# Patient Record
Sex: Male | Born: 1995 | Race: White | Hispanic: No | Marital: Single | Smoking: Never smoker
Health system: Southern US, Community
[De-identification: ages and names within clinical notes are randomized; demographics above are authoritative.]

## PROBLEM LIST (undated history)

## (undated) DIAGNOSIS — F319 Bipolar disorder, unspecified: Secondary | ICD-10-CM

## (undated) DIAGNOSIS — F840 Autistic disorder: Secondary | ICD-10-CM

## (undated) DIAGNOSIS — F419 Anxiety disorder, unspecified: Secondary | ICD-10-CM

## (undated) DIAGNOSIS — T7840XA Allergy, unspecified, initial encounter: Secondary | ICD-10-CM

## (undated) DIAGNOSIS — G4700 Insomnia, unspecified: Secondary | ICD-10-CM

## (undated) HISTORY — DX: Insomnia, unspecified: G47.00

## (undated) HISTORY — DX: Bipolar disorder, unspecified: F31.9

## (undated) HISTORY — DX: Autistic disorder: F84.0

## (undated) HISTORY — DX: Anxiety disorder, unspecified: F41.9

## (undated) HISTORY — DX: Allergy, unspecified, initial encounter: T78.40XA

---
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
## 2015-07-01 DIAGNOSIS — K029 Dental caries, unspecified: Secondary | ICD-10-CM | POA: Insufficient documentation

## 2016-02-08 DIAGNOSIS — R94120 Abnormal auditory function study: Secondary | ICD-10-CM | POA: Insufficient documentation

## 2017-12-26 DIAGNOSIS — K5909 Other constipation: Secondary | ICD-10-CM | POA: Insufficient documentation

## 2018-02-10 DIAGNOSIS — R6250 Unspecified lack of expected normal physiological development in childhood: Secondary | ICD-10-CM | POA: Insufficient documentation

## 2018-02-10 DIAGNOSIS — H6123 Impacted cerumen, bilateral: Secondary | ICD-10-CM | POA: Insufficient documentation

## 2019-10-05 ENCOUNTER — Other Ambulatory Visit: Payer: Self-pay

## 2019-10-05 ENCOUNTER — Ambulatory Visit (HOSPITAL_COMMUNITY)
Admission: EM | Admit: 2019-10-05 | Discharge: 2019-10-05 | Disposition: A | Discharge status: Home / Self Care | Payer: Medicare Other | Attending: Family Medicine | Admitting: Family Medicine

## 2019-10-05 ENCOUNTER — Encounter (HOSPITAL_COMMUNITY): Payer: Self-pay

## 2019-10-05 DIAGNOSIS — W228XXA Striking against or struck by other objects, initial encounter: Secondary | ICD-10-CM | POA: Diagnosis not present

## 2019-10-05 DIAGNOSIS — S0990XA Unspecified injury of head, initial encounter: Secondary | ICD-10-CM

## 2019-10-05 DIAGNOSIS — U071 COVID-19: Secondary | ICD-10-CM | POA: Diagnosis not present

## 2019-10-05 NOTE — ED Triage Notes (Signed)
Pt caregiver states pt banged head purposely on wall last night. Negative LOC, or n/v afterwards. Pt presents with edema, and laceration to mid-forehead; no acute bleeding. Pt non-verbal but able to touch injured area w/o obvious pain, able to visually use electronic game; PERLLA.

## 2019-10-05 NOTE — Discharge Instructions (Signed)
Tylenol and ibuprofen as needed for pain and swelling Ice Monitor for patient to return to normal/baseline habits and movements If developing weakness, not moving eyes appropriately, not normal resposiveness follow up in ED  COVID swab from 12/30 was positive I recommend retesting other resident and employees especially if developing symptoms Quarantine 10 days, and monitor for any symptoms to develop  Follow up for any concerns

## 2019-10-07 NOTE — ED Provider Notes (Signed)
[place]    CSN: [id number] Arrival date & time: 10/05/19  [place]      History   Chief Complaint Chief Complaint  Patient presents with  . Head Injury    HPI Shane Dickerson is a 24 y.o. male history of autism, bipolar type I, nonverbal presenting today along with group home employee for evaluation of head injury.  Last night patient hit his head repetitively on the wall.  This morning he has developed a area of swelling and minor scabbing to his central forehead.  Group home employee states that this is normal behavior for him.  Since he has been at his baseline and acting normally.  He has been eating and drinking appropriately.  Peripherally moving his eyes as well as extremities.  Denies any vomiting.  Of note patient did recently get Covid tested due to exposure at group home and his test is positive.  Denies any current URI symptoms or fevers.  HPI  Past Medical History:  Diagnosis Date  . Allergy    seasonal allergies  . Anxiety   . Autism spectrum   . Bipolar 1 disorder ([place])   . Insomnia     There are no problems to display for this patient.   History reviewed. No pertinent surgical history.     Home Medications    Prior to Admission medications   Medication Sig Start Date End Date Taking? Authorizing Provider  cetirizine (ZYRTEC) 10 MG tablet Take 10 mg by mouth.    [provider]  clonazePAM (KLONOPIN) 1 MG tablet Take 1 mg by mouth 2 (two) times daily.    [provider]  fluticasone (FLOVENT DISKUS) 50 MCG/BLIST diskus inhaler Inhale 1 puff into the lungs daily.    [provider]  OLANZapine (ZYPREXA) 5 MG tablet Take 5 mg by mouth at bedtime.    [provider]  Oxcarbazepine (TRILEPTAL) 300 MG tablet Take 300 mg by mouth daily. 1.5 tablets    [provider]  risperiDONE (RISPERDAL) 2 MG tablet Take 2 mg by mouth at bedtime.    [provider]  selenium sulfide (SELSUN) 2.5 % shampoo  Apply 1 application topically daily as needed for irritation.    [provider]    Family History History reviewed. No pertinent family history.  Social History Social History   Tobacco Use  . Smoking status: Never Smoker  . Smokeless tobacco: Never Used  Substance Use Topics  . Alcohol use: Never  . Drug use: Never     Allergies   Patient has no allergy information on record.   Review of Systems Review of Systems  Constitutional: Negative for fatigue and fever.  HENT: Positive for facial swelling. Negative for congestion, sinus pressure and sore throat.   Eyes: Negative for photophobia, pain and visual disturbance.  Respiratory: Negative for cough and shortness of breath.   Cardiovascular: Negative for chest pain.  Gastrointestinal: Negative for abdominal pain, nausea and vomiting.  Genitourinary: Negative for decreased urine volume and hematuria.  Musculoskeletal: Negative for myalgias, neck pain and neck stiffness.  Neurological: Positive for headaches. Negative for dizziness, syncope, facial asymmetry, speech difficulty, weakness, light-headedness and numbness.     Physical Exam Triage Vital Signs ED Triage Vitals  Enc Vitals Group     BP 10/05/19 1751 114/71     Pulse Rate 10/05/19 1751 83     Resp 10/05/19 1751 18     Temp 10/05/19 1751 98.3 F (36.8 C)  Temp Source 10/05/19 1751 Oral     SpO2 10/05/19 1751 98 %     Weight --      Height --      Head Circumference --      Peak Flow --      Pain Score 10/05/19 1746 0     Pain Loc --      Pain Edu? --      Excl. in GC? --    No data found.  Updated Vital Signs BP 114/71 (BP Location: Right Arm)   Pulse 83   Temp 98.3 F (36.8 C) (Oral)   Resp 18   SpO2 98%   Visual Acuity Right Eye Distance:   Left Eye Distance:   Bilateral Distance:    Right Eye Near:   Left Eye Near:    Bilateral Near:     Physical Exam Vitals and nursing note reviewed.  Constitutional:      Appearance:  He is well-developed.     Comments: No acute distress; sitting on exam table playing video game  HENT:     Head: Normocephalic and atraumatic.     Comments: Central forehead with area of swelling and superficial abrasion, tender to touch in this area, no surrounding erythema or warmth, no palpable crepitus, no appearance of grimacing or discomfort with palpating around orbits    Right Ear: Tympanic membrane normal.     Left Ear: Tympanic membrane normal.     Ears:     Comments: No hemotympanum bilaterally    Nose: Nose normal.     Mouth/Throat:     Comments: Oral mucosa pink and moist, no tonsillar enlargement or exudate. Posterior pharynx patent and nonerythematous, no uvula deviation or swelling. Normal phonation. Palate elevates symmetrically Eyes:     Extraocular Movements: Extraocular movements intact.     Conjunctiva/sclera: Conjunctivae normal.     Pupils: Pupils are equal, round, and reactive to light.     Comments: Eye tracking grossly normal  Cardiovascular:     Rate and Rhythm: Normal rate.  Pulmonary:     Effort: Pulmonary effort is normal. No respiratory distress.     Comments: Breathing comfortably at rest, CTABL, no wheezing, rales or other adventitious sounds auscultated Abdominal:     General: There is no distension.  Musculoskeletal:        General: Normal range of motion.     Cervical back: Neck supple.     Comments: Moving all extremities appropriately  Skin:    General: Skin is warm and dry.  Neurological:     General: No focal deficit present.     Mental Status: He is alert and oriented to person, place, and time. Mental status is at baseline.     Cranial Nerves: No cranial nerve deficit.     Motor: No weakness.      UC Treatments / Results  Labs (all labs ordered are listed, but only abnormal results are displayed) Labs Reviewed - No data to display  EKG   Radiology No results found.  Procedures Procedures (including critical care  time)  Medications Ordered in UC Medications - No data to display  Initial Impression / Assessment and Plan / UC Course  I have reviewed the triage vital signs and the nursing notes.  Pertinent labs & imaging results that were available during my care of the patient were reviewed by me and considered in my medical decision making (see chart for details).     Patient appears to  be at baseline, no neuro deficits noted, but given patient nonverbal and autism and difficult to fully complete thorough neuro exam.  Discussed with group home member to continue to monitor for patient to return to baseline if at any point he seems less responsive any abnormalities with eye movements, not moving extremities appropriately or developing vomiting to follow-up in emergency room for CT scan.  Tylenol ibuprofen and ice.  Discussed quarantine recommendations given positive Covid as patient and group home were unaware of this result as he had had a negative rapid.  Discussed strict return precautions. Patient verbalized understanding and is agreeable with plan.  Final Clinical Impressions(s) / UC Diagnoses   Final diagnoses:  Minor head injury, initial encounter  COVID-19 virus detected     Discharge Instructions     Tylenol and ibuprofen as needed for pain and swelling Ice Monitor for patient to return to normal/baseline habits and movements If developing weakness, not moving eyes appropriately, not normal resposiveness follow up in ED  COVID swab from 12/30 was positive I recommend retesting other resident and employees especially if developing symptoms Quarantine 10 days, and monitor for any symptoms to develop  Follow up for any concerns   ED Prescriptions    None     PDMP not reviewed this encounter.   Sharyon Cable [place] C, PA-C 10/07/19 1213

## 2019-12-28 ENCOUNTER — Emergency Department (HOSPITAL_COMMUNITY)
Admission: EM | Admit: 2019-12-28 | Discharge: 2019-12-28 | Disposition: A | Discharge status: Home / Self Care | Payer: Medicare Other | Attending: Emergency Medicine | Admitting: Emergency Medicine

## 2019-12-28 ENCOUNTER — Emergency Department (HOSPITAL_COMMUNITY): Payer: Medicare Other

## 2019-12-28 ENCOUNTER — Encounter (HOSPITAL_COMMUNITY): Payer: Self-pay | Admitting: Radiology

## 2019-12-28 DIAGNOSIS — Z87891 Personal history of nicotine dependence: Secondary | ICD-10-CM | POA: Insufficient documentation

## 2019-12-28 DIAGNOSIS — S0083XA Contusion of other part of head, initial encounter: Secondary | ICD-10-CM | POA: Insufficient documentation

## 2019-12-28 DIAGNOSIS — Y999 Unspecified external cause status: Secondary | ICD-10-CM | POA: Insufficient documentation

## 2019-12-28 DIAGNOSIS — Z79899 Other long term (current) drug therapy: Secondary | ICD-10-CM | POA: Insufficient documentation

## 2019-12-28 DIAGNOSIS — Y929 Unspecified place or not applicable: Secondary | ICD-10-CM | POA: Insufficient documentation

## 2019-12-28 DIAGNOSIS — Y939 Activity, unspecified: Secondary | ICD-10-CM | POA: Diagnosis not present

## 2019-12-28 DIAGNOSIS — S0990XA Unspecified injury of head, initial encounter: Secondary | ICD-10-CM

## 2019-12-28 DIAGNOSIS — F840 Autistic disorder: Secondary | ICD-10-CM | POA: Insufficient documentation

## 2019-12-28 DIAGNOSIS — X79XXXA Intentional self-harm by blunt object, initial encounter: Secondary | ICD-10-CM | POA: Diagnosis not present

## 2019-12-28 LAB — COMPREHENSIVE METABOLIC PANEL
ALT: 22 U/L (ref 0–44)
AST: 17 U/L (ref 15–41)
Albumin: 4 g/dL (ref 3.5–5.0)
Alkaline Phosphatase: 73 U/L (ref 38–126)
Anion gap: 7 (ref 5–15)
BUN: 14 mg/dL (ref 6–20)
CO2: 24 mmol/L (ref 22–32)
Calcium: 8.8 mg/dL — ABNORMAL LOW (ref 8.9–10.3)
Chloride: 108 mmol/L (ref 98–111)
Creatinine, Ser: 0.75 mg/dL (ref 0.61–1.24)
GFR calc Af Amer: >60 mL/min (ref >60)
GFR calc non Af Amer: >60 mL/min (ref >60)
Glucose, Bld: 108 mg/dL — ABNORMAL HIGH (ref 70–99)
Potassium: 4 mmol/L (ref 3.5–5.1)
Sodium: 139 mmol/L (ref 135–145)
Total Bilirubin: 0.6 mg/dL (ref 0.3–1.2)
Total Protein: 7 g/dL (ref 6.5–8.1)

## 2019-12-28 LAB — CBC WITH DIFFERENTIAL/PLATELET
Abs Immature Granulocytes: 0.01 10*3/uL (ref 0.00–0.07)
Basophils Absolute: 0 10*3/uL (ref 0.0–0.1)
Basophils Relative: 0 %
Eosinophils Absolute: 0.1 10*3/uL (ref 0.0–0.5)
Eosinophils Relative: 2 %
HCT: 39.6 % (ref 39.0–52.0)
Hemoglobin: 13.2 g/dL (ref 13.0–17.0)
Immature Granulocytes: 0 %
Lymphocytes Relative: 34 %
Lymphs Abs: 1.4 10*3/uL (ref 0.7–4.0)
MCH: 30.1 pg (ref 26.0–34.0)
MCHC: 33.3 g/dL (ref 30.0–36.0)
MCV: 90.4 fL (ref 80.0–100.0)
Monocytes Absolute: 0.4 10*3/uL (ref 0.1–1.0)
Monocytes Relative: 9 %
Neutro Abs: 2.2 10*3/uL (ref 1.7–7.7)
Neutrophils Relative %: 55 %
Platelets: 267 10*3/uL (ref 150–400)
RBC: 4.38 MIL/uL (ref 4.22–5.81)
RDW: 11.9 % (ref 11.5–15.5)
WBC: 4 10*3/uL (ref 4.0–10.5)
nRBC: 0 % (ref 0.0–0.2)

## 2019-12-28 LAB — URINALYSIS, ROUTINE W REFLEX MICROSCOPIC
Bilirubin Urine: NEGATIVE
Glucose, UA: NEGATIVE mg/dL
Hgb urine dipstick: NEGATIVE
Ketones, ur: NEGATIVE mg/dL
Leukocytes,Ua: NEGATIVE
Nitrite: NEGATIVE
Protein, ur: NEGATIVE mg/dL
Specific Gravity, Urine: 1.021 (ref 1.005–1.030)
pH: 7 (ref 5.0–8.0)

## 2019-12-28 NOTE — ED Triage Notes (Signed)
24 yo male from a group home [facility] [address] brought in by [facility]. Pt ambulated to ED room 21 independently and without incident. Per staff at group home, pt has been awake for the past 3 days and presenting with "manic" behavior. Pt removed his safety helmet today and repeatedly hit himself with it on head. Lac to forehead, bleeding controlled on arrival to ED. Staff member at bedside.

## 2019-12-28 NOTE — ED Provider Notes (Signed)
[facility] DEPT Provider Note   CSN: [id number] Arrival date & time: 12/28/19  1008     History Chief Complaint  Patient presents with  . hitting head with helmet    Shane Dickerson is a 24 y.o. male.  24 y.o male with a PMH of Autism, Bipolar presents to the ED with a chief via [place] for head injury. According to nursing staff at the bedside patient wears a safety helmet, he removed the helmet today was hitting himself on the front of the head with the helmet. He has a minor laceration along with hematoma noted to the area. According to facility, he has not slept in 3 days, and his behavior has continued to worsen. RN staffing will be coming into ED to provide further history. Patient is non verbal at baseline.  The history is provided by medical records and a caregiver.       Past Medical History:  Diagnosis Date  . Allergy    seasonal allergies  . Anxiety   . Autism spectrum   . Bipolar 1 disorder ([place])   . Insomnia     There are no problems to display for this patient.   No past surgical history on file.     No family history on file.  Social History   Tobacco Use  . Smoking status: Never Smoker  . Smokeless tobacco: Never Used  Substance Use Topics  . Alcohol use: Never  . Drug use: Never    Home Medications Prior to Admission medications   Medication Sig Start Date End Date Taking? Authorizing Provider  acetaminophen (TYLENOL) 325 MG tablet Take 650 mg by mouth every 4 (four) hours as needed for mild pain, fever or headache.   Yes [provider]  benztropine (COGENTIN) 0.5 MG tablet Take 0.5 mg by mouth at bedtime.   Yes [provider]  bismuth subsalicylate (PEPTO BISMOL) 262 MG chewable tablet Chew 524 mg by mouth as needed for diarrhea or loose stools (nausea).   Yes [provider]  carbamide peroxide (DEBROX) 6.5 % OTIC solution Place 2 drops into both ears 2 (two) times daily as needed (ear  ache).   Yes [provider]  cetirizine (ZYRTEC) 10 MG tablet Take 10 mg by mouth daily.    Yes [provider]  clonazePAM (KLONOPIN) 1 MG tablet Take 1 mg by mouth 2 (two) times daily.   Yes [provider]  fluticasone (VERAMYST) 27.5 MCG/SPRAY nasal spray Place 2 sprays into the nose daily.   Yes [provider]  guaifenesin (ROBITUSSIN) 100 MG/5ML syrup Take 200 mg by mouth 3 (three) times daily as needed for cough or congestion.   Yes [provider]  hydrocortisone cream 1 % Apply 1 application topically 3 (three) times daily as needed for itching.   Yes [provider]  ketoconazole (NIZORAL) 2 % shampoo Apply 1 application topically as needed for irritation.   Yes [provider]  loperamide (IMODIUM A-D) 2 MG tablet Take 2-4 mg by mouth 4 (four) times daily as needed for diarrhea or loose stools.   Yes [provider]  neomycin-bacitracin-polymyxin (NEOSPORIN) ointment Apply 1 application topically 2 (two) times daily as needed for wound care.   Yes [provider]  OLANZapine (ZYPREXA) 5 MG tablet Take 5 mg by mouth at bedtime.   Yes [provider]  Oxcarbazepine (TRILEPTAL) 300 MG tablet Take 450 mg by mouth 2 (two) times daily. 1.5 tablets  Yes [provider]  QUEtiapine (SEROQUEL XR) 50 MG TB24 24 hr tablet Take 50 mg by mouth daily.   Yes [provider]  risperiDONE (RISPERDAL) 1 MG tablet Take 1 mg by mouth 3 (three) times daily.   Yes [provider]  selenium sulfide (SELSUN) 2.5 % shampoo Apply 1 application topically once a week. saturdays   Yes [provider]  traZODone (DESYREL) 100 MG tablet Take 100 mg by mouth at bedtime.   Yes [provider]    Allergies    Patient has no known allergies.  Review of Systems   Review of Systems  Unable to perform ROS: Patient nonverbal    Physical Exam Updated Vital Signs BP 129/87   Pulse  62   Temp 97.8 F (36.6 C) (Oral)   Resp 18   SpO2 97%   Physical Exam Vitals and nursing note reviewed.  Constitutional:      Appearance: Normal appearance.  HENT:     Head: Normocephalic.      Nose: Nose normal.  Eyes:     Pupils: Pupils are equal, round, and reactive to light.  Cardiovascular:     Rate and Rhythm: Normal rate.  Pulmonary:     Effort: Pulmonary effort is normal.     Breath sounds: No wheezing or rales.  Abdominal:     General: Abdomen is flat.     Tenderness: There is no abdominal tenderness. There is no right CVA tenderness or left CVA tenderness.  Skin:    General: Skin is warm and dry.  Neurological:     Mental Status: He is alert. Mental status is at baseline.     Comments: Patient is nonverbal at baseline, according to facility he is within his baseline.     ED Results / Procedures / Treatments   Labs (all labs ordered are listed, but only abnormal results are displayed) Labs Reviewed  COMPREHENSIVE METABOLIC PANEL - Abnormal; Notable for the following components:      Result Value   Glucose, Bld 108 (*)    Calcium 8.8 (*)    All other components within normal limits  CBC WITH DIFFERENTIAL/PLATELET  URINALYSIS, ROUTINE W REFLEX MICROSCOPIC    EKG None  Radiology CT Head Wo Contrast  Result Date: 12/28/2019 CLINICAL DATA:  Patient with mania. EXAM: CT HEAD WITHOUT CONTRAST TECHNIQUE: Contiguous axial images were obtained from the base of the skull through the vertex without intravenous contrast. COMPARISON:  None. FINDINGS: Brain: No evidence of acute infarction, hemorrhage, hydrocephalus, extra-axial collection or mass lesion/mass effect. Vascular: Unremarkable Skull: Intact. Sinuses/Orbits: Paranasal sinuses are well aerated. Mastoid air cells are unremarkable. Orbits are unremarkable. Other: None. IMPRESSION: No acute intracranial process. Electronically Signed   By: Annia Belt M.D.   On: 12/28/2019 13:47    Procedures Procedures  (including critical care time)  Medications Ordered in ED Medications - No data to display  ED Course  I have reviewed the triage vital signs and the nursing notes.  Pertinent labs & imaging results that were available during my care of the patient were reviewed by me and considered in my medical decision making (see chart for details).    MDM Rules/Calculators/A&P   Patient non verbal at baseline with a hx of head hitting presents to the ED for hitting his head with his safety helmet at [facility] facility.  He is accompanied by nursing staff who report patient has not slept in several days, states he is now manic, due  to his decrease sleep.  He is currently on trazodone 100 mg for bedtime but this medication is not helping with his sleep pattern.   Patient is at his baseline per nursing staff from [facility] facility.  Interpretation of his labs by me, CBC without any leukocytosis, hemoglobin is within normal limits.  CMP without any electrolyte derangement, LFTs are unremarkable.  UA without any leukocytes, nitrites, no urinary symptoms, lower suspicion for UTI.  A CT head has been obtained as patient repeatedly struck himself in the head with helmet.  1:39 PM results of all testing were discussed with nurse at the bedside, who is currently taking care of him at [facility] facility.  CT head is currently pending, discussed that he will likely be dispositioned home after turns as long as it is within normal limits.  CT Head without any acute process. Return precautions discussed at length.    Portions of this note were generated with Scientist, clinical (histocompatibility and immunogenetics). Dictation errors may occur despite best attempts at proofreading.  Final Clinical Impression(s) / ED Diagnoses Final diagnoses:  Injury of head, initial encounter    Rx / DC Orders ED Discharge Orders    None       Claude Manges, PA-C 12/28/19 1355    Lorre Nick, MD 12/29/19 [phone]

## 2019-12-28 NOTE — Discharge Instructions (Addendum)
Your laboratory results were within normal limits.  The CT of your head showed no acute findings.  Please follow up with your PCP for further management of your sleeping medication.

## 2020-02-03 ENCOUNTER — Other Ambulatory Visit: Payer: Self-pay

## 2020-02-03 ENCOUNTER — Encounter (HOSPITAL_COMMUNITY): Payer: Self-pay

## 2020-02-03 ENCOUNTER — Emergency Department (HOSPITAL_COMMUNITY)
Admission: EM | Admit: 2020-02-03 | Discharge: 2020-02-03 | Disposition: A | Discharge status: Home / Self Care | Payer: Medicare Other | Attending: Emergency Medicine | Admitting: Emergency Medicine

## 2020-02-03 ENCOUNTER — Emergency Department (HOSPITAL_COMMUNITY): Payer: Medicare Other

## 2020-02-03 DIAGNOSIS — Y929 Unspecified place or not applicable: Secondary | ICD-10-CM | POA: Diagnosis not present

## 2020-02-03 DIAGNOSIS — Y999 Unspecified external cause status: Secondary | ICD-10-CM | POA: Insufficient documentation

## 2020-02-03 DIAGNOSIS — S0990XA Unspecified injury of head, initial encounter: Secondary | ICD-10-CM | POA: Insufficient documentation

## 2020-02-03 DIAGNOSIS — Z79899 Other long term (current) drug therapy: Secondary | ICD-10-CM | POA: Insufficient documentation

## 2020-02-03 DIAGNOSIS — F840 Autistic disorder: Secondary | ICD-10-CM | POA: Insufficient documentation

## 2020-02-03 DIAGNOSIS — X79XXXA Intentional self-harm by blunt object, initial encounter: Secondary | ICD-10-CM | POA: Diagnosis not present

## 2020-02-03 DIAGNOSIS — Y9389 Activity, other specified: Secondary | ICD-10-CM | POA: Insufficient documentation

## 2020-02-03 NOTE — ED Provider Notes (Signed)
[facility]-EMERGENCY DEPT Provider Note   CSN: [id number] Arrival date & time: 02/03/20  1229     History Chief Complaint  Patient presents with  . Head Injury    Shane Dickerson is a 24 y.o. male.  HPI   Patient is a 24 year old male with a history of seasonal allergies, anxiety, autism spectrum disorder, bipolar 1 disorder, insomnia, who presents the emergency department today with his caregiver for evaluation of a head injury.  Patient is nonverbal at baseline therefore there is a level 5 caveat.  History is obtained from the patient's caregiver who is at bedside.  She states that the patient normally wears a helmet to protect his head as he frequently hits his head on things when he gets frustrated.  Yesterday took his helmet off and repeatedly hit his head with it.  He has an abrasion and hematoma to the forehead.  He was seen at urgent care prior to arrival and sent here for head CT.  She denies any loss of consciousness or episodes of vomiting since the injury occurred.  He has had no changes in his behavior.  She states this is very normal behavior for him  Past Medical History:  Diagnosis Date  . Allergy    seasonal allergies  . Anxiety   . Autism spectrum   . Bipolar 1 disorder (HCC)   . Insomnia     There are no problems to display for this patient.   History reviewed. No pertinent surgical history.     Family History  Family history unknown: Yes    Social History   Tobacco Use  . Smoking status: Never Smoker  . Smokeless tobacco: Never Used  Substance Use Topics  . Alcohol use: Never  . Drug use: Never    Home Medications Prior to Admission medications   Medication Sig Start Date End Date Taking? Authorizing Provider  acetaminophen (TYLENOL) 325 MG tablet Take 650 mg by mouth every 4 (four) hours as needed for mild pain, fever or headache.    [provider]  benztropine (COGENTIN) 0.5 MG tablet Take 0.5 mg by mouth at  bedtime.    [provider]  bismuth subsalicylate (PEPTO BISMOL) 262 MG chewable tablet Chew 524 mg by mouth as needed for diarrhea or loose stools (nausea).    [provider]  carbamide peroxide (DEBROX) 6.5 % OTIC solution Place 2 drops into both ears 2 (two) times daily as needed (ear ache).    [provider]  cetirizine (ZYRTEC) 10 MG tablet Take 10 mg by mouth daily.     [provider]  clonazePAM (KLONOPIN) 1 MG tablet Take 1 mg by mouth 2 (two) times daily.    [provider]  fluticasone (VERAMYST) 27.5 MCG/SPRAY nasal spray Place 2 sprays into the nose daily.    [provider]  guaifenesin (ROBITUSSIN) 100 MG/5ML syrup Take 200 mg by mouth 3 (three) times daily as needed for cough or congestion.    [provider]  hydrocortisone cream 1 % Apply 1 application topically 3 (three) times daily as needed for itching.    [provider]  ketoconazole (NIZORAL) 2 % shampoo Apply 1 application topically as needed for irritation.    [provider]  loperamide (IMODIUM A-D) 2 MG tablet Take 2-4 mg by mouth 4 (four) times daily as needed for diarrhea or loose stools.    [provider]  neomycin-bacitracin-polymyxin (NEOSPORIN) ointment Apply 1 application topically 2 (two)  times daily as needed for wound care.    [provider]  OLANZapine (ZYPREXA) 5 MG tablet Take 5 mg by mouth at bedtime.    [provider]  Oxcarbazepine (TRILEPTAL) 300 MG tablet Take 450 mg by mouth 2 (two) times daily. 1.5 tablets     [provider]  QUEtiapine (SEROQUEL XR) 50 MG TB24 24 hr tablet Take 50 mg by mouth daily.    [provider]  risperiDONE (RISPERDAL) 1 MG tablet Take 1 mg by mouth 3 (three) times daily.    [provider]  selenium sulfide (SELSUN) 2.5 % shampoo Apply 1 application topically once a week. saturdays    [provider]  traZODone (DESYREL) 100 MG  tablet Take 100 mg by mouth at bedtime.    [provider]    Allergies    Patient has no known allergies.  Review of Systems   Review of Systems  Unable to perform ROS: Patient nonverbal  Gastrointestinal: Negative for nausea and vomiting.  Neurological:       Head injury, no loc    Physical Exam Updated Vital Signs BP 132/75 (BP Location: Right Arm)   Pulse (!) 106   Temp 98.7 F (37.1 C) (Oral)   Resp 18   Ht 5\' 11"  (1.803 m)   Wt 113.4 kg   SpO2 99%   BMI 34.87 kg/m   Physical Exam Constitutional:      General: He is not in acute distress.    Appearance: He is well-developed.  HENT:     Head:     Comments: Small hematoma with abrasion to the forehead Eyes:     Conjunctiva/sclera: Conjunctivae normal.  Cardiovascular:     Rate and Rhythm: Normal rate.  Pulmonary:     Effort: Pulmonary effort is normal.  Skin:    General: Skin is warm and dry.  Neurological:     Mental Status: He is alert and oriented to person, place, and time.     Comments: Mental Status:  Alert, nonverbal at baseline, follows commands,  Cranial Nerves:  II: pupils equal, round, reactive to light III,IV, VI: ptosis not present, extra-ocular motions intact bilaterally  V,VII: smile symmetric, facial light touch sensation equal VIII: hearing grossly normal to voice  X: uvula elevates symmetrically  XI: bilateral shoulder shrug symmetric and strong XII: midline tongue extension without fassiculations Motor:  Normal tone. 5/5 strength of BUE and BLE major muscle groups including strong and equal grip strength and dorsiflexion/plantar flexion Sensory: light touch normal in all extremities.     ED Results / Procedures / Treatments   Labs (all labs ordered are listed, but only abnormal results are displayed) Labs Reviewed - No data to display  EKG None  Radiology CT Head Wo Contrast  Result Date: 02/03/2020 CLINICAL DATA:  Headache, posttraumatic EXAM: CT HEAD WITHOUT  CONTRAST TECHNIQUE: Contiguous axial images were obtained from the base of the skull through the vertex without intravenous contrast. COMPARISON:  12/28/2019 FINDINGS: Brain: No evidence of acute infarction, hemorrhage, hydrocephalus, extra-axial collection or mass lesion/mass effect. Vascular: No hyperdense vessel or unexpected calcification. Skull: Normal. Negative for fracture or focal lesion. Sinuses/Orbits: No acute finding. Other: Soft tissue contusion of the midline forehead. IMPRESSION: No acute intracranial pathology. Electronically Signed   By: Eddie Candle M.D.   On: 02/03/2020 14:29    Procedures Procedures (including critical care time)  Medications Ordered in ED Medications - No data to display  ED Course  I  have reviewed the triage vital signs and the nursing notes.  Pertinent labs & imaging results that were available during my care of the patient were reviewed by me and considered in my medical decision making (see chart for details).    MDM Rules/Calculators/A&P                       24 year old male presenting for evaluation of head injury that occurred yesterday after he hit his head repeatedly on a helmet.  He has history of similar behavior when he gets frustrated.  He did not have any LOC.  He has had no episodes of vomiting.  He is at his mental baseline at this time.  He was seen in urgent care prior to arrival and sent here for CT scan to rule out intracranial injury.  CT reviewed/interpreted without evidence of intracranial bleeding or other acute abnormalities.  Caretaker advised on follow-up and return precautions.  She voiced understanding plan reasons to return.  All questions answered.  Patient stable for discharge with   Final Clinical Impression(s) / ED Diagnoses Final diagnoses:  Injury of head, initial encounter    Rx / DC Orders ED Discharge Orders    None       Karrie Meres, PA-C 02/03/20 1452    Bethann Berkshire, MD 02/05/20 [phone]

## 2020-02-03 NOTE — Discharge Instructions (Signed)
HEAD INJURY °If any of the following occur notify your physician or go to °the Hospital Emergency Department: ° Increased drowsiness, stupor or loss of consciousness ° Restlessness or convulsions (fits) ° Paralysis in arms or legs ° Temperature above 100º F ° Vomiting ° Severe headache ° Blood or clear fluid dripping from the nose or ears ° Stiffness of the neck ° Dizziness or blurred vision ° Pulsating pain in the eye ° Unequal pupils of eye ° Personality changes ° Any other unusual symptoms °PRECAUTIONS ° Do not take tranquilizers, sedatives, narcotics or alcohol ° Avoid aspirin. Use only acetaminophen (e.g. Tylenol) or °ibuprofen (e.g. Advil) for relief of pain. Follow directions °on the bottle for dosage. ° Use ice packs for comfort ° Getting plenty of rest and sleep helps the brain to heal. Do not try to do too much too fast. As you start to feel better, you can slowly and gradually return to your usual routine. ° Avoid activities that are physically demanding (e.g., sports, heavy housecleaning, exercising) or require a lot of thinking or concentration (e.g., working on the computer, playing video games). ° Ask your health care professional when you can safely drive a car, ride a bike, or operate heavy equipment. °MEDICATIONS °Use medications only as directed by your physician ° °Follow up with your primary care provider within 5-7 days for re-evaluation.  ° °

## 2020-02-03 NOTE — ED Triage Notes (Addendum)
Caregiver reports patient was banging his head against his helmet last night.   [facility] went to [facility] and told to come to ED for follow up to make sure he does not have a concussion since the patient dose this often.   Patient at his baseline per caregiver at bedside.

## 2020-02-03 NOTE — ED Notes (Signed)
Patient is autistic and did not want his vitals taken again.

## 2020-06-12 ENCOUNTER — Emergency Department (HOSPITAL_COMMUNITY)
Admission: EM | Admit: 2020-06-12 | Discharge: 2020-06-12 | Disposition: A | Discharge status: Home / Self Care | Payer: Medicare Other | Attending: Emergency Medicine | Admitting: Emergency Medicine

## 2020-06-12 ENCOUNTER — Other Ambulatory Visit: Payer: Self-pay

## 2020-06-12 ENCOUNTER — Emergency Department (HOSPITAL_COMMUNITY): Payer: Medicare Other

## 2020-06-12 DIAGNOSIS — Z79899 Other long term (current) drug therapy: Secondary | ICD-10-CM | POA: Insufficient documentation

## 2020-06-12 DIAGNOSIS — S0093XA Contusion of unspecified part of head, initial encounter: Secondary | ICD-10-CM | POA: Diagnosis present

## 2020-06-12 DIAGNOSIS — X58XXXA Exposure to other specified factors, initial encounter: Secondary | ICD-10-CM | POA: Diagnosis not present

## 2020-06-12 DIAGNOSIS — Y999 Unspecified external cause status: Secondary | ICD-10-CM | POA: Insufficient documentation

## 2020-06-12 DIAGNOSIS — Y929 Unspecified place or not applicable: Secondary | ICD-10-CM | POA: Diagnosis not present

## 2020-06-12 DIAGNOSIS — R456 Violent behavior: Secondary | ICD-10-CM | POA: Diagnosis not present

## 2020-06-12 DIAGNOSIS — Y939 Activity, unspecified: Secondary | ICD-10-CM | POA: Diagnosis not present

## 2020-06-12 DIAGNOSIS — R4689 Other symptoms and signs involving appearance and behavior: Secondary | ICD-10-CM

## 2020-06-12 LAB — COMPREHENSIVE METABOLIC PANEL
ALT: 32 U/L (ref 0–44)
AST: 26 U/L (ref 15–41)
Albumin: 4 g/dL (ref 3.5–5.0)
Alkaline Phosphatase: 93 U/L (ref 38–126)
Anion gap: 12 (ref 5–15)
BUN: 9 mg/dL (ref 6–20)
CO2: 23 mmol/L (ref 22–32)
Calcium: 8.8 mg/dL — ABNORMAL LOW (ref 8.9–10.3)
Chloride: 101 mmol/L (ref 98–111)
Creatinine, Ser: 0.73 mg/dL (ref 0.61–1.24)
GFR calc Af Amer: >60 mL/min (ref >60)
GFR calc non Af Amer: >60 mL/min (ref >60)
Glucose, Bld: 120 mg/dL — ABNORMAL HIGH (ref 70–99)
Potassium: 3.9 mmol/L (ref 3.5–5.1)
Sodium: 136 mmol/L (ref 135–145)
Total Bilirubin: 0.5 mg/dL (ref 0.3–1.2)
Total Protein: 7.3 g/dL (ref 6.5–8.1)

## 2020-06-12 LAB — CBC WITH DIFFERENTIAL/PLATELET
Abs Immature Granulocytes: 0.02 10*3/uL (ref 0.00–0.07)
Basophils Absolute: 0 10*3/uL (ref 0.0–0.1)
Basophils Relative: 0 %
Eosinophils Absolute: 0.1 10*3/uL (ref 0.0–0.5)
Eosinophils Relative: 2 %
HCT: 38.4 % — ABNORMAL LOW (ref 39.0–52.0)
Hemoglobin: 13.1 g/dL (ref 13.0–17.0)
Immature Granulocytes: 0 %
Lymphocytes Relative: 32 %
Lymphs Abs: 1.8 10*3/uL (ref 0.7–4.0)
MCH: 28.9 pg (ref 26.0–34.0)
MCHC: 34.1 g/dL (ref 30.0–36.0)
MCV: 84.8 fL (ref 80.0–100.0)
Monocytes Absolute: 0.6 10*3/uL (ref 0.1–1.0)
Monocytes Relative: 10 %
Neutro Abs: 3.1 10*3/uL (ref 1.7–7.7)
Neutrophils Relative %: 56 %
Platelets: 264 10*3/uL (ref 150–400)
RBC: 4.53 MIL/uL (ref 4.22–5.81)
RDW: 11.6 % (ref 11.5–15.5)
WBC: 5.6 10*3/uL (ref 4.0–10.5)
nRBC: 0 % (ref 0.0–0.2)

## 2020-06-12 LAB — URINALYSIS, ROUTINE W REFLEX MICROSCOPIC
Bilirubin Urine: NEGATIVE
Glucose, UA: NEGATIVE mg/dL
Hgb urine dipstick: NEGATIVE
Ketones, ur: NEGATIVE mg/dL
Leukocytes,Ua: NEGATIVE
Nitrite: NEGATIVE
Protein, ur: NEGATIVE mg/dL
Specific Gravity, Urine: 1.009 (ref 1.005–1.030)
pH: 7 (ref 5.0–8.0)

## 2020-06-12 MED ORDER — ACETAMINOPHEN 325 MG PO TABS
650.0000 mg | ORAL_TABLET | Freq: Once | ORAL | Status: AC
  Administered 2020-06-12: 650 mg via ORAL
  Filled 2020-06-12: qty 2

## 2020-06-12 MED ORDER — HYDROXYZINE HCL 25 MG PO TABS
50.0000 mg | ORAL_TABLET | Freq: Once | ORAL | Status: AC
  Administered 2020-06-12: 50 mg via ORAL
  Filled 2020-06-12: qty 2

## 2020-06-12 MED ORDER — MELATONIN 3 MG PO TABS: 3.0000 mg | ORAL_TABLET | Freq: Every evening | ORAL | 0 refills | Status: AC | PRN

## 2020-06-12 NOTE — ED Provider Notes (Signed)
[facility]-EMERGENCY DEPT Provider Note   CSN: [id number] Arrival date & time: 06/12/20  1503     History Chief Complaint  Patient presents with  . Medical Clearance    Shane Dickerson is a 24 y.o. male who presents today with group home staff for evaluation of behavioral outburst.  They report that today he has been aggressive with another resident.  They state that he has been banging his head.  No known loss of consciousness, he does not take any blood thinning medications.  They report no new medication changes.  As far as they are aware he has not been sick recently.  He is still eating and drinking normally.  No coughing or known fevers.  Level 5 caveat, patient is nonverbal.  HPI     Past Medical History:  Diagnosis Date  . Allergy    seasonal allergies  . Anxiety   . Autism spectrum   . Bipolar 1 disorder (HCC)   . Insomnia     There are no problems to display for this patient.   No past surgical history on file.     Family History  Family history unknown: Yes    Social History   Tobacco Use  . Smoking status: Never Smoker  . Smokeless tobacco: Never Used  Vaping Use  . Vaping Use: Never used  Substance Use Topics  . Alcohol use: Never  . Drug use: Never    Home Medications Prior to Admission medications   Medication Sig Start Date End Date Taking? Authorizing Provider  acetaminophen (TYLENOL) 325 MG tablet Take 650 mg by mouth every 4 (four) hours as needed for mild pain, fever or headache.   Yes [provider]  benztropine (COGENTIN) 1 MG tablet Take 1 mg by mouth 2 (two) times daily.   Yes [provider]  bismuth subsalicylate (BISMATROL) 262 MG chewable tablet Chew 524 mg by mouth as needed (nausea/upset stomach/max 8 doses in 24 hours).   Yes [provider]  carbamide peroxide (DEBROX) 6.5 % OTIC solution Place 2 drops into both ears 2 (two) times daily as needed (ear ache/impacted ear wax).     Yes [provider]  cetirizine (ZYRTEC) 10 MG tablet Take 10 mg by mouth at bedtime.    Yes [provider]  clonazePAM (KLONOPIN) 1 MG tablet Take 1 mg by mouth 2 (two) times daily.   Yes [provider]  FLUoxetine (PROZAC) 20 MG capsule Take 20 mg by mouth daily.   Yes [provider]  fluticasone (VERAMYST) 27.5 MCG/SPRAY nasal spray Place 1 spray into the nose daily.    Yes [provider]  guaiFENesin (SILTUSSIN DAS) 100 MG/5ML liquid Take 200 mg by mouth 3 (three) times daily as needed for cough or congestion.   Yes [provider]  haloperidol (HALDOL) 10 MG tablet Take 10 mg by mouth 2 (two) times daily.   Yes [provider]  hydrocortisone cream 1 % Apply 1 application topically 3 (three) times daily as needed (skin irritation).    Yes [provider]  hydrOXYzine (VISTARIL) 50 MG capsule Take 50 mg by mouth See admin instructions. Take one capsule (50 mg) by mouth three times daily - 8am, 4pm, 8pm   Yes [provider]  ketoconazole (NIZORAL) 2 % shampoo Apply 1 application topically daily as needed for irritation.    Yes [provider]  loperamide (IMODIUM A-D) 2 MG tablet Take 2-4 mg by mouth See admin  instructions. Take 2 tablets (4 mg) by mouth after loose b.m. and 1 tablet (2 mg0 after each loose b.m. - max 16 mg/day   Yes [provider]  magnesium hydroxide (MILK OF MAGNESIA) 400 MG/5ML suspension Take 30 mLs by mouth at bedtime as needed (constipation).   Yes [provider]  Neomycin-Bacitracin-Polymyxin (TRIPLE ANTIBIOTIC) [phone] OINT Apply 1 application topically 2 (two) times daily as needed (skin abrasians).   Yes [provider]  Oxcarbazepine (TRILEPTAL) 300 MG tablet Take 450 mg by mouth 2 (two) times daily.    Yes [provider]  selenium sulfide (SELSUN) 2.5 % shampoo Apply 1 application topically every Saturday.    Yes [provider]  temazepam (RESTORIL) 15 MG capsule Take 15 mg by mouth at bedtime.   Yes [provider]  zolpidem (AMBIEN) 10 MG tablet Take 10 mg by mouth at bedtime.   Yes [provider]  melatonin 3 MG TABS tablet Take 1 tablet (3 mg total) by mouth at bedtime as needed (Sleep). 06/12/20 07/12/20  Cristina Gong, PA-C    Allergies    Patient has no known allergies.  Review of Systems   Review of Systems  Unable to perform ROS: Patient nonverbal    Physical Exam Updated Vital Signs BP 124/86   Pulse 78   Temp 97.7 F (36.5 C)   Resp 14   SpO2 94%   Physical Exam Vitals and nursing note reviewed.  Constitutional:      General: He is not in acute distress.    Appearance: He is well-developed. He is not diaphoretic.  HENT:     Head: Normocephalic.     Comments: Contusion on anterior forehead.  No raccoon's eyes or battle signs bilaterally.    Right Ear: Tympanic membrane normal.     Left Ear: Tympanic membrane normal.     Mouth/Throat:     Mouth: Mucous membranes are moist.     Pharynx: Oropharynx is clear. No oropharyngeal exudate or posterior oropharyngeal erythema.  Eyes:     General: No scleral icterus.       Right eye: No discharge.        Left eye: No discharge.     Conjunctiva/sclera: Conjunctivae normal.  Cardiovascular:     Rate and Rhythm: Normal rate and regular rhythm.     Heart sounds: Normal heart sounds.  Pulmonary:     Effort: Pulmonary effort is normal. No respiratory distress.     Breath sounds: Normal breath sounds. No stridor.  Abdominal:     General: There is no distension.     Tenderness: There is no abdominal tenderness. There is no guarding.  Musculoskeletal:        General: No deformity.     Cervical back: Normal range of motion and neck supple.  Skin:    General: Skin is warm and dry.  Neurological:     Mental Status: He is alert.     Motor: No abnormal muscle tone.     Comments: Patient is awake, moves upper  and lower extremities spontaneously.  Vocalizes and gestures to indicate his wants and needs  Psychiatric:     Comments: Redirectable, cooperative, playing on his tablet in no obvious distress, currently baseline behavior per group home staff.     ED Results / Procedures / Treatments   Labs (all labs ordered are listed, but only abnormal results are displayed) Labs Reviewed  COMPREHENSIVE METABOLIC PANEL - Abnormal; Notable for the following  components:      Result Value   Glucose, Bld 120 (*)    Calcium 8.8 (*)    All other components within normal limits  CBC WITH DIFFERENTIAL/PLATELET - Abnormal; Notable for the following components:   HCT 38.4 (*)    All other components within normal limits  URINALYSIS, ROUTINE W REFLEX MICROSCOPIC - Abnormal; Notable for the following components:   Color, Urine STRAW (*)    All other components within normal limits    EKG None  Radiology No results found.  Procedures Procedures (including critical care time)  Medications Ordered in ED Medications  acetaminophen (TYLENOL) tablet 650 mg (650 mg Oral Given 06/12/20 1824)  hydrOXYzine (ATARAX/VISTARIL) tablet 50 mg (50 mg Oral Given 06/12/20 1824)    ED Course  I have reviewed the triage vital signs and the nursing notes.  Pertinent labs & imaging results that were available during my care of the patient were reviewed by me and considered in my medical decision making (see chart for details).  Clinical Course as of Jun 13 50  Wynelle Link Jun 12, 2020  1805 While attempting to obtain CT scan patient became very agitated.  Was unable to obtain CT scan.  I went with patient and his caregiver back to patient's room.  Patient was then calm and redirectable.  He is overdue for his hydroxyzine, this is ordered along with Tylenol.  Asked nurse to obtain vitals as this has not yet been done.  His CBC, CMP and UA are all reassuring.  His meds in the chart are not up-to-date, I spoke with his caregiver,  it does not appear that he is taking melatonin.  He is not sleeping well.    [EH]    Clinical Course User Index [EH] Norman Clay   MDM Rules/Calculators/A&P                         Patient is a 24 year old man who is here today for evaluation of behavioral outburst.  He is from his group home.  Group home staff reports that he has had increased head-banging and has been aggressive towards other residents which is new over the past few days.  Group home staff, who is in contact with guardian she also.  \ Labs were obtained to evaluate for underlying medical cause for his condition.  CBC CMP and UA are all without significant abnormalities that might explain his behavior changes.  Did attempt CT scan on head given head-banging activities and hematoma on forehead, however patient became very agitated during this.  Given that it would require significant sedation to get in CT scan we will not pursue at this time after discussions with group home staff who was in contact with his legal guardian.  He was given his home hydroxyzine.  No clear medical cause for his issue found.  They requested help with patient sleeping.  He is not on melatonin.  He is already taking Ambien and multiple other medications at night.  Prescription given for melatonin, group home staff states that she will check with legal guardian prior to giving it to patient.  I recommended that they consult his psychiatrist tomorrow for medication recommendations given that patient has multiple complex psychiatric medications it would be outside the usual scope of emergency medicine to make these adjustments.  Return precautions were discussed with the group home staff who states their understanding.  At the time of discharge parent denied any  unaddressed complaints or concerns. Group home staff is agreeable for discharge home.  Note: Portions of this report may have been transcribed using voice recognition software.  Every effort was made to ensure accuracy; however, inadvertent computerized transcription errors may be present   Final Clinical Impression(s) / ED Diagnoses Final diagnoses:  Aggressive behavior    Rx / DC Orders ED Discharge Orders         Ordered    melatonin 3 MG TABS tablet  At bedtime PRN        06/12/20 1911           Norman Clay 06/13/20 0051    Tilden Fossa, MD 06/15/20 0710

## 2020-06-12 NOTE — Discharge Instructions (Addendum)
Please give him his PRN tylenol scheduled for the next day to see if that helps with his symptoms.   He was given tylenol and atarax in the ER.   Today we attempted a CT scan but due to agitation we were unable to complete this.

## 2020-06-12 NOTE — ED Triage Notes (Signed)
Pt to [facility] by EMS. Pt from group home. Pt is autistic . Pt had behavioral outburst at Group Home, was aggressive. Pt cooperative here.

## 2020-09-06 DIAGNOSIS — H9193 Unspecified hearing loss, bilateral: Secondary | ICD-10-CM | POA: Insufficient documentation

## 2021-09-24 ENCOUNTER — Emergency Department (HOSPITAL_COMMUNITY): Payer: Medicare Other

## 2021-09-24 ENCOUNTER — Encounter (HOSPITAL_COMMUNITY): Payer: Self-pay

## 2021-09-24 ENCOUNTER — Inpatient Hospital Stay (HOSPITAL_COMMUNITY)
Admission: EM | Admit: 2021-09-24 | Discharge: 2021-09-27 | DRG: 101 | Disposition: A | Discharge status: Home / Self Care | Payer: Medicare Other | Attending: Internal Medicine | Admitting: Internal Medicine

## 2021-09-24 ENCOUNTER — Other Ambulatory Visit: Payer: Self-pay

## 2021-09-24 DIAGNOSIS — Z79899 Other long term (current) drug therapy: Secondary | ICD-10-CM

## 2021-09-24 DIAGNOSIS — R569 Unspecified convulsions: Secondary | ICD-10-CM

## 2021-09-24 DIAGNOSIS — D72829 Elevated white blood cell count, unspecified: Secondary | ICD-10-CM | POA: Diagnosis present

## 2021-09-24 DIAGNOSIS — D539 Nutritional anemia, unspecified: Secondary | ICD-10-CM | POA: Diagnosis present

## 2021-09-24 DIAGNOSIS — E871 Hypo-osmolality and hyponatremia: Secondary | ICD-10-CM | POA: Diagnosis present

## 2021-09-24 DIAGNOSIS — G4700 Insomnia, unspecified: Secondary | ICD-10-CM | POA: Diagnosis present

## 2021-09-24 DIAGNOSIS — G40909 Epilepsy, unspecified, not intractable, without status epilepticus: Principal | ICD-10-CM | POA: Diagnosis present

## 2021-09-24 DIAGNOSIS — F419 Anxiety disorder, unspecified: Secondary | ICD-10-CM | POA: Diagnosis present

## 2021-09-24 DIAGNOSIS — Z9152 Personal history of nonsuicidal self-harm: Secondary | ICD-10-CM

## 2021-09-24 DIAGNOSIS — F840 Autistic disorder: Secondary | ICD-10-CM | POA: Diagnosis present

## 2021-09-24 DIAGNOSIS — F319 Bipolar disorder, unspecified: Secondary | ICD-10-CM | POA: Diagnosis present

## 2021-09-24 DIAGNOSIS — Z20822 Contact with and (suspected) exposure to covid-19: Secondary | ICD-10-CM | POA: Diagnosis present

## 2021-09-24 LAB — CBC WITH DIFFERENTIAL/PLATELET
Abs Immature Granulocytes: 0.04 10*3/uL (ref 0.00–0.07)
Basophils Absolute: 0 10*3/uL (ref 0.0–0.1)
Basophils Relative: 0 %
Eosinophils Absolute: 0 10*3/uL (ref 0.0–0.5)
Eosinophils Relative: 0 %
HCT: 28.5 % — ABNORMAL LOW (ref 39.0–52.0)
Hemoglobin: 9.8 g/dL — ABNORMAL LOW (ref 13.0–17.0)
Immature Granulocytes: 1 %
Lymphocytes Relative: 16 %
Lymphs Abs: 1.2 10*3/uL (ref 0.7–4.0)
MCH: 34.5 pg — ABNORMAL HIGH (ref 26.0–34.0)
MCHC: 34.4 g/dL (ref 30.0–36.0)
MCV: 100.4 fL — ABNORMAL HIGH (ref 80.0–100.0)
Monocytes Absolute: 1 10*3/uL (ref 0.1–1.0)
Monocytes Relative: 14 %
Neutro Abs: 5.2 10*3/uL (ref 1.7–7.7)
Neutrophils Relative %: 69 %
Platelets: 156 10*3/uL (ref 150–400)
RBC: 2.84 MIL/uL — ABNORMAL LOW (ref 4.22–5.81)
RDW: 12.3 % (ref 11.5–15.5)
WBC: 7.5 10*3/uL (ref 4.0–10.5)
nRBC: 0 % (ref 0.0–0.2)

## 2021-09-24 LAB — COMPREHENSIVE METABOLIC PANEL
ALT: 19 U/L (ref 0–44)
AST: 26 U/L (ref 15–41)
Albumin: 3 g/dL — ABNORMAL LOW (ref 3.5–5.0)
Alkaline Phosphatase: 42 U/L (ref 38–126)
Anion gap: 6 (ref 5–15)
BUN: 8 mg/dL (ref 6–20)
CO2: 24 mmol/L (ref 22–32)
Calcium: 7.5 mg/dL — ABNORMAL LOW (ref 8.9–10.3)
Chloride: 105 mmol/L (ref 98–111)
Creatinine, Ser: 0.66 mg/dL (ref 0.61–1.24)
GFR, Estimated: >60 mL/min (ref >60)
Glucose, Bld: 101 mg/dL — ABNORMAL HIGH (ref 70–99)
Potassium: 3.6 mmol/L (ref 3.5–5.1)
Sodium: 135 mmol/L (ref 135–145)
Total Bilirubin: 0.4 mg/dL (ref 0.3–1.2)
Total Protein: 5.6 g/dL — ABNORMAL LOW (ref 6.5–8.1)

## 2021-09-24 LAB — RESP PANEL BY RT-PCR (FLU A&B, COVID) ARPGX2
Influenza A by PCR: NEGATIVE
Influenza B by PCR: NEGATIVE
SARS Coronavirus 2 by RT PCR: NEGATIVE

## 2021-09-24 LAB — MAGNESIUM: Magnesium: 1.5 mg/dL — ABNORMAL LOW (ref 1.7–2.4)

## 2021-09-24 MED ORDER — TEMAZEPAM 15 MG PO CAPS
15.0000 mg | ORAL_CAPSULE | Freq: Once | ORAL | Status: AC
  Administered 2021-09-24: 22:00:00 15 mg via ORAL
  Filled 2021-09-24: qty 1

## 2021-09-24 MED ORDER — MAGNESIUM SULFATE 2 GM/50ML IV SOLN
2.0000 g | Freq: Once | INTRAVENOUS | Status: AC
  Administered 2021-09-24: 21:00:00 2 g via INTRAVENOUS
  Filled 2021-09-24: qty 50

## 2021-09-24 MED ORDER — OXCARBAZEPINE 300 MG PO TABS: 600.0000 mg | ORAL_TABLET | Freq: Two times a day (BID) | ORAL | 0 refills | Status: DC

## 2021-09-24 MED ORDER — OXCARBAZEPINE 300 MG PO TABS
450.0000 mg | ORAL_TABLET | Freq: Two times a day (BID) | ORAL | Status: DC
  Administered 2021-09-24: 21:00:00 450 mg via ORAL
  Filled 2021-09-24: qty 1

## 2021-09-24 MED ORDER — LORAZEPAM 2 MG/ML IJ SOLN
INTRAMUSCULAR | Status: AC
  Administered 2021-09-24: 22:00:00 1 mg
  Filled 2021-09-24: qty 1

## 2021-09-24 MED ORDER — LEVETIRACETAM IN NACL 1500 MG/100ML IV SOLN
1500.0000 mg | Freq: Once | INTRAVENOUS | Status: AC
  Administered 2021-09-24: 23:00:00 1500 mg via INTRAVENOUS
  Filled 2021-09-24: qty 100

## 2021-09-24 MED ORDER — ONDANSETRON HCL 4 MG/2ML IJ SOLN
4.0000 mg | Freq: Once | INTRAMUSCULAR | Status: AC
  Administered 2021-09-24: 20:00:00 4 mg via INTRAVENOUS
  Filled 2021-09-24: qty 2

## 2021-09-24 MED ORDER — TEMAZEPAM 15 MG PO CAPS: 15.0000 mg | ORAL_CAPSULE | Freq: Every evening | ORAL | Status: DC | PRN

## 2021-09-24 MED ORDER — TRAZODONE HCL 50 MG PO TABS
150.0000 mg | ORAL_TABLET | Freq: Every day | ORAL | Status: DC
  Administered 2021-09-24 – 2021-09-26 (×3): 150 mg via ORAL
  Filled 2021-09-24 (×2): qty 1
  Filled 2021-09-24: qty 3

## 2021-09-24 NOTE — ED Provider Notes (Signed)
[facility] EMERGENCY DEPARTMENT Provider Note   CSN: [id number] Arrival date & time: 09/24/21  1756     History Chief Complaint  Patient presents with   Seizures    Wing Schoch is a 25 y.o. male with a history of autism, nonverbal, presenting from a group home with concern for possible seizure.  Patient cannot provide history as he is nonverbal, but the group home manager is present at the bedside.  She reports that this evening around 5 pm the patient was found in the living room, with generalized tonic-clonic activity, shaking of the arms and legs, eyes half open.  He was not responding to voice.  This lasted about 1 to 2 minutes.  Afterwards he seemed to be gasping for air, CPR was briefly initiated, when EMS arrived the patient was still not back to baseline.  Here in the ED the patient appears back to his baseline mental status.  He does not appear in any distress.  Group home manager reports that he has no prior history of seizures.  He is on Trileptal for behavioral issues, also temazepam at night for again behavioral issues and sleep.  He does have a history of self-harm behavior with head injury and striking his head on surfaces, occasionally wears a helmet.  They did not report any such behavior this evening.  HPI     Past Medical History:  Diagnosis Date   Allergy    seasonal allergies   Anxiety    Autism spectrum    Bipolar 1 disorder (HCC)    Insomnia     There are no problems to display for this patient.   History reviewed. No pertinent surgical history.     Family History  Family history unknown: Yes    Social History   Tobacco Use   Smoking status: Never   Smokeless tobacco: Never  Vaping Use   Vaping Use: Never used  Substance Use Topics   Alcohol use: Never   Drug use: Never    Home Medications Prior to Admission medications   Medication Sig Start Date End Date Taking? Authorizing Provider  acetaminophen (TYLENOL)  325 MG tablet Take 650 mg by mouth every 4 (four) hours as needed for mild pain, fever or headache.    [provider]  benztropine (COGENTIN) 1 MG tablet Take 1 mg by mouth 2 (two) times daily.    [provider]  bismuth subsalicylate (BISMATROL) 262 MG chewable tablet Chew 524 mg by mouth as needed (nausea/upset stomach/max 8 doses in 24 hours).    [provider]  carbamide peroxide (DEBROX) 6.5 % OTIC solution Place 2 drops into both ears 2 (two) times daily as needed (ear ache/impacted ear wax).     [provider]  cetirizine (ZYRTEC) 10 MG tablet Take 10 mg by mouth at bedtime.     [provider]  clonazePAM (KLONOPIN) 1 MG tablet Take 1 mg by mouth 2 (two) times daily.    [provider]  FLUoxetine (PROZAC) 20 MG capsule Take 20 mg by mouth daily.    [provider]  fluticasone (VERAMYST) 27.5 MCG/SPRAY nasal spray Place 1 spray into the nose daily.     [provider]  guaiFENesin (SILTUSSIN DAS) 100 MG/5ML liquid Take 200 mg by mouth 3 (three) times daily as needed for cough or congestion.    [provider]  haloperidol (HALDOL) 10 MG tablet Take 10 mg by mouth 2 (two) times daily.  [provider]  hydrocortisone cream 1 % Apply 1 application topically 3 (three) times daily as needed (skin irritation).     [provider]  hydrOXYzine (VISTARIL) 50 MG capsule Take 50 mg by mouth See admin instructions. Take one capsule (50 mg) by mouth three times daily - 8am, 4pm, 8pm    [provider]  ketoconazole (NIZORAL) 2 % shampoo Apply 1 application topically daily as needed for irritation.     [provider]  loperamide (IMODIUM A-D) 2 MG tablet Take 2-4 mg by mouth See admin instructions. Take 2 tablets (4 mg) by mouth after loose b.m. and 1 tablet (2 mg0 after each loose b.m. - max 16 mg/day    [provider]  magnesium hydroxide (MILK OF MAGNESIA) 400 MG/5ML  suspension Take 30 mLs by mouth at bedtime as needed (constipation).    [provider]  Neomycin-Bacitracin-Polymyxin (TRIPLE ANTIBIOTIC) [phone] OINT Apply 1 application topically 2 (two) times daily as needed (skin abrasians).    [provider]  Oxcarbazepine (TRILEPTAL) 300 MG tablet Take 450 mg by mouth 2 (two) times daily.     [provider]  selenium sulfide (SELSUN) 2.5 % shampoo Apply 1 application topically every Saturday.     [provider]  temazepam (RESTORIL) 15 MG capsule Take 15 mg by mouth at bedtime.    [provider]  zolpidem (AMBIEN) 10 MG tablet Take 10 mg by mouth at bedtime.    [provider]    Allergies    Patient has no known allergies.  Review of Systems   Review of Systems  Unable to perform ROS: Patient nonverbal (level 5 caveat)   Physical Exam Updated Vital Signs BP 109/74    Pulse 82    Temp 97.8 F (36.6 C) (Axillary)    Resp 20    Ht 5\' 11"  (1.803 m)    SpO2 97%    BMI 34.87 kg/m   Physical Exam Constitutional:      General: He is not in acute distress. HENT:     Head: Normocephalic and atraumatic.  Eyes:     Conjunctiva/sclera: Conjunctivae normal.     Pupils: Pupils are equal, round, and reactive to light.  Cardiovascular:     Rate and Rhythm: Normal rate and regular rhythm.  Pulmonary:     Effort: Pulmonary effort is normal. No respiratory distress.  Abdominal:     General: There is no distension.     Tenderness: There is no abdominal tenderness.  Skin:    General: Skin is warm and dry.  Neurological:     General: No focal deficit present.     Mental Status: He is alert. Mental status is at baseline.  Psychiatric:        Mood and Affect: Mood normal.        Behavior: Behavior normal.    ED Results / Procedures / Treatments   Labs (all labs ordered are listed, but only abnormal results are displayed) Labs Reviewed - No data to display  EKG None  Radiology No  results found.  Procedures Procedures   Medications Ordered in ED Medications - No data to display  ED Course  I have reviewed the triage vital signs and the nursing notes.  Pertinent labs & imaging results that were available during my care of the patient were reviewed by me and considered in my medical decision making (see chart for details).  Pt here with suspected new onset seizure, witnessed  at group home Already on trilepta and temazepam daily for behavioral issues - reportedly compliant with these meds, as they are given by group home staff  Pt back to baseline mental state here  Workup included CTH and dg chest, which per my interpretation show no evidence of ICH or CVA, no PNA.  CBC wnl.  Mg low 1.5, Ca 7.5, Alb 3.0 (grouip home reports he's had poor appetite, recently stared on boost/ensure drinks).  Clinical Course as of 09/25/21 1368  Wynelle Link Sep 24, 2021  2053 Hypocalcemia in setting of low albumin, Ca corrects to approx 8.3 [MT]  2055 [facility] and DG chest reviewed, unremarkable per my interpretation. [MT]  2121 Pt signed out to Dr Posey Rea EDP pending f/u on remaining labs - c/s with neurology regarding AED's [MT]    Clinical Course User Index [MT] Izick Gasbarro, Kermit Balo, MD    Final Clinical Impression(s) / ED Diagnoses Final diagnoses:  None    Rx / DC Orders ED Discharge Orders     None        Terald Sleeper, MD 09/25/21 [phone]

## 2021-09-24 NOTE — ED Notes (Signed)
Pt family member yelling for assistance. Pt found having full tonic clonic seizure in bed. SZ pads already applied on bed and pt head turned for vomit. Airway suctioned, pt placed on NRM with MD at bedside.

## 2021-09-24 NOTE — ED Notes (Addendum)
Pt's caregiver recognized a seizure and called for help. Matt RN and [place] NT were able to secure and suction his airway as well as give him additional O2 via non-rebreather. Pt was stabilized and this RN gave 1mg  of Ativan. MD Kommor made aware and was at bedside.

## 2021-09-24 NOTE — ED Triage Notes (Signed)
Pt BIB EMS due to a seizure. Pt was at his group home and they witnessed a 3 min seizure.Pt has autism and is non- verbal. Pt does not have seizures at baseline. EMS states pt was post-ictal.

## 2021-09-24 NOTE — ED Provider Notes (Signed)
°  Physical Exam  BP (!) 149/126    Pulse (!) 107    Temp 97.8 F (36.6 C) (Axillary)    Resp 20    Ht 5\' 11"  (1.803 m)    SpO2 100%    BMI 34.87 kg/m   Physical Exam Constitutional:      General: He is not in acute distress.    Appearance: Normal appearance.  HENT:     Head: Normocephalic and atraumatic.     Nose: No congestion or rhinorrhea.  Eyes:     General:        Right eye: No discharge.        Left eye: No discharge.     Extraocular Movements: Extraocular movements intact.     Pupils: Pupils are equal, round, and reactive to light.  Cardiovascular:     Rate and Rhythm: Normal rate and regular rhythm.     Heart sounds: No murmur heard. Pulmonary:     Effort: No respiratory distress.     Breath sounds: No wheezing or rales.  Abdominal:     General: There is no distension.     Tenderness: There is no abdominal tenderness.  Musculoskeletal:        General: Normal range of motion.     Cervical back: Normal range of motion.  Skin:    General: Skin is warm and dry.  Neurological:     Mental Status: He is alert.     Comments: Patient postictal    ED Course/Procedures   Clinical Course as of 09/24/21 2350  Sun Sep 24, 2021  2053 Hypocalcemia in setting of low albumin, Ca corrects to approx 8.3 [MT]  2055 [facility] and DG chest reviewed, unremarkable per my interpretation. [MT]  2121 Pt signed out to Dr 2122 EDP pending f/u on remaining labs - c/s with neurology regarding AED's [MT]    Clinical Course User Index [MT] Trifan, Posey Rea, MD    Procedures  MDM  Patient seen emergency department for evaluation of seizures.  Patient received in signout with neurologic recommendations currently pending.  Initial neurologic recommendations were to increase patient's oxcarbazepine to 600 twice daily and have a repeat chemistry done next week.  Just before the patient was to be discharged, he had a 30 episode of general tonic-clonic seizure behavior.  Patient was given Ativan and  loaded with Keppra and will require hospital admission for persistent seizure behavior.  Patient then admitted.       Kermit Balo, MD 09/25/21 [phone]

## 2021-09-24 NOTE — ED Triage Notes (Addendum)
Pt BIB EMS after having a tonic clonic seizure lasting roughly 3 minutes. Staff at his facility recognized the seizure and were able to get him down on the ground. They noted to EMS, that his respiratory rate slowed and he became cyanotic and pale. Pt is nonverbal at baseline and is autistic. After seizure activity stopped pt returned to normal baseline and alertness.   VSS w/ EMS.

## 2021-09-25 ENCOUNTER — Inpatient Hospital Stay (HOSPITAL_COMMUNITY): Payer: Medicare Other

## 2021-09-25 ENCOUNTER — Other Ambulatory Visit: Payer: Self-pay

## 2021-09-25 ENCOUNTER — Other Ambulatory Visit (HOSPITAL_COMMUNITY): Payer: Self-pay

## 2021-09-25 ENCOUNTER — Encounter (HOSPITAL_COMMUNITY): Payer: Self-pay | Admitting: Internal Medicine

## 2021-09-25 DIAGNOSIS — F840 Autistic disorder: Secondary | ICD-10-CM | POA: Diagnosis present

## 2021-09-25 DIAGNOSIS — R569 Unspecified convulsions: Secondary | ICD-10-CM

## 2021-09-25 DIAGNOSIS — D72829 Elevated white blood cell count, unspecified: Secondary | ICD-10-CM | POA: Diagnosis present

## 2021-09-25 DIAGNOSIS — Z79899 Other long term (current) drug therapy: Secondary | ICD-10-CM | POA: Diagnosis not present

## 2021-09-25 DIAGNOSIS — E871 Hypo-osmolality and hyponatremia: Secondary | ICD-10-CM | POA: Diagnosis present

## 2021-09-25 DIAGNOSIS — D539 Nutritional anemia, unspecified: Secondary | ICD-10-CM | POA: Diagnosis present

## 2021-09-25 DIAGNOSIS — Z20822 Contact with and (suspected) exposure to covid-19: Secondary | ICD-10-CM | POA: Diagnosis present

## 2021-09-25 DIAGNOSIS — G4700 Insomnia, unspecified: Secondary | ICD-10-CM | POA: Diagnosis present

## 2021-09-25 DIAGNOSIS — F419 Anxiety disorder, unspecified: Secondary | ICD-10-CM | POA: Diagnosis present

## 2021-09-25 DIAGNOSIS — F319 Bipolar disorder, unspecified: Secondary | ICD-10-CM | POA: Diagnosis present

## 2021-09-25 DIAGNOSIS — Z9152 Personal history of nonsuicidal self-harm: Secondary | ICD-10-CM | POA: Diagnosis not present

## 2021-09-25 DIAGNOSIS — G40909 Epilepsy, unspecified, not intractable, without status epilepticus: Secondary | ICD-10-CM | POA: Diagnosis present

## 2021-09-25 LAB — COMPREHENSIVE METABOLIC PANEL
ALT: 21 U/L (ref 0–44)
AST: 23 U/L (ref 15–41)
Albumin: 3.3 g/dL — ABNORMAL LOW (ref 3.5–5.0)
Alkaline Phosphatase: 44 U/L (ref 38–126)
Anion gap: 8 (ref 5–15)
BUN: 7 mg/dL (ref 6–20)
CO2: 27 mmol/L (ref 22–32)
Calcium: 8.7 mg/dL — ABNORMAL LOW (ref 8.9–10.3)
Chloride: 97 mmol/L — ABNORMAL LOW (ref 98–111)
Creatinine, Ser: 0.53 mg/dL — ABNORMAL LOW (ref 0.61–1.24)
GFR, Estimated: >60 mL/min (ref >60)
Glucose, Bld: 118 mg/dL — ABNORMAL HIGH (ref 70–99)
Potassium: 3.8 mmol/L (ref 3.5–5.1)
Sodium: 132 mmol/L — ABNORMAL LOW (ref 135–145)
Total Bilirubin: 0.4 mg/dL (ref 0.3–1.2)
Total Protein: 6.3 g/dL — ABNORMAL LOW (ref 6.5–8.1)

## 2021-09-25 LAB — GLUCOSE, CAPILLARY: Glucose-Capillary: 126 mg/dL — ABNORMAL HIGH (ref 70–99)

## 2021-09-25 LAB — CBC
HCT: 31.2 % — ABNORMAL LOW (ref 39.0–52.0)
Hemoglobin: 10.7 g/dL — ABNORMAL LOW (ref 13.0–17.0)
MCH: 33.2 pg (ref 26.0–34.0)
MCHC: 34.3 g/dL (ref 30.0–36.0)
MCV: 96.9 fL (ref 80.0–100.0)
Platelets: 196 10*3/uL (ref 150–400)
RBC: 3.22 MIL/uL — ABNORMAL LOW (ref 4.22–5.81)
RDW: 12.4 % (ref 11.5–15.5)
WBC: 11.8 10*3/uL — ABNORMAL HIGH (ref 4.0–10.5)
nRBC: 0 % (ref 0.0–0.2)

## 2021-09-25 LAB — FOLATE: Folate: 14.1 ng/mL (ref >5.9)

## 2021-09-25 LAB — VITAMIN B12: Vitamin B-12: 608 pg/mL (ref 180–914)

## 2021-09-25 LAB — FERRITIN: Ferritin: 215 ng/mL (ref 24–336)

## 2021-09-25 LAB — RETICULOCYTES
Immature Retic Fract: 22.2 % — ABNORMAL HIGH (ref 2.3–15.9)
RBC.: 3.15 MIL/uL — ABNORMAL LOW (ref 4.22–5.81)
Retic Count, Absolute: 115.6 10*3/uL (ref 19.0–186.0)
Retic Ct Pct: 3.7 % — ABNORMAL HIGH (ref 0.4–3.1)

## 2021-09-25 LAB — IRON AND TIBC
Iron: 31 ug/dL — ABNORMAL LOW (ref 45–182)
Saturation Ratios: 10 % — ABNORMAL LOW (ref 17.9–39.5)
TIBC: 326 ug/dL (ref 250–450)
UIBC: 295 ug/dL

## 2021-09-25 LAB — HIV ANTIBODY (ROUTINE TESTING W REFLEX): HIV Screen 4th Generation wRfx: NONREACTIVE

## 2021-09-25 LAB — VALPROIC ACID LEVEL: Valproic Acid Lvl: 11 ug/mL — ABNORMAL LOW (ref 50.0–100.0)

## 2021-09-25 MED ORDER — MAGNESIUM SULFATE 2 GM/50ML IV SOLN
2.0000 g | Freq: Once | INTRAVENOUS | Status: AC
  Administered 2021-09-25: 04:00:00 2 g via INTRAVENOUS
  Filled 2021-09-25: qty 50

## 2021-09-25 MED ORDER — ACETAMINOPHEN 650 MG RE SUPP: 650.0000 mg | Freq: Four times a day (QID) | RECTAL | Status: DC | PRN

## 2021-09-25 MED ORDER — HALOPERIDOL 5 MG PO TABS
5.0000 mg | ORAL_TABLET | Freq: Three times a day (TID) | ORAL | Status: DC
  Administered 2021-09-25 – 2021-09-27 (×7): 5 mg via ORAL
  Filled 2021-09-25 (×7): qty 1

## 2021-09-25 MED ORDER — TEMAZEPAM 15 MG PO CAPS
15.0000 mg | ORAL_CAPSULE | Freq: Every day | ORAL | Status: DC
  Administered 2021-09-25 – 2021-09-26 (×2): 15 mg via ORAL
  Filled 2021-09-25 (×2): qty 1

## 2021-09-25 MED ORDER — SELENIUM SULFIDE 2.5 % EX LOTN: 1.0000 "application " | TOPICAL_LOTION | CUTANEOUS | Status: DC

## 2021-09-25 MED ORDER — ACETAMINOPHEN 325 MG PO TABS: 650.0000 mg | ORAL_TABLET | Freq: Four times a day (QID) | ORAL | Status: DC | PRN

## 2021-09-25 MED ORDER — FLUTICASONE FUROATE 27.5 MCG/SPRAY NA SUSP: 1.0000 | Freq: Every day | NASAL | Status: DC

## 2021-09-25 MED ORDER — DIVALPROEX SODIUM ER 500 MG PO TB24
500.0000 mg | ORAL_TABLET | Freq: Three times a day (TID) | ORAL | Status: DC
  Administered 2021-09-25: 11:00:00 500 mg via ORAL
  Filled 2021-09-25: qty 1

## 2021-09-25 MED ORDER — LEVETIRACETAM IN NACL 500 MG/100ML IV SOLN
500.0000 mg | Freq: Two times a day (BID) | INTRAVENOUS | Status: DC
  Administered 2021-09-25 – 2021-09-27 (×5): 500 mg via INTRAVENOUS
  Filled 2021-09-25 (×5): qty 100

## 2021-09-25 MED ORDER — DIVALPROEX SODIUM ER 500 MG PO TB24
750.0000 mg | ORAL_TABLET | Freq: Three times a day (TID) | ORAL | Status: DC
  Administered 2021-09-25 – 2021-09-27 (×6): 750 mg via ORAL
  Filled 2021-09-25 (×8): qty 1

## 2021-09-25 MED ORDER — BENZTROPINE MESYLATE 1 MG PO TABS
1.0000 mg | ORAL_TABLET | Freq: Two times a day (BID) | ORAL | Status: DC
  Administered 2021-09-25 – 2021-09-27 (×5): 1 mg via ORAL
  Filled 2021-09-25 (×7): qty 1

## 2021-09-25 MED ORDER — ENOXAPARIN SODIUM 40 MG/0.4ML IJ SOSY
40.0000 mg | PREFILLED_SYRINGE | INTRAMUSCULAR | Status: DC
  Administered 2021-09-25 – 2021-09-27 (×3): 40 mg via SUBCUTANEOUS
  Filled 2021-09-25 (×3): qty 0.4

## 2021-09-25 MED ORDER — OXCARBAZEPINE 300 MG PO TABS
600.0000 mg | ORAL_TABLET | Freq: Two times a day (BID) | ORAL | Status: DC
  Administered 2021-09-25 – 2021-09-27 (×5): 600 mg via ORAL
  Filled 2021-09-25 (×5): qty 2

## 2021-09-25 MED ORDER — LORATADINE 10 MG PO TABS
10.0000 mg | ORAL_TABLET | Freq: Every day | ORAL | Status: DC
  Administered 2021-09-25 – 2021-09-26 (×2): 10 mg via ORAL
  Filled 2021-09-25 (×2): qty 1

## 2021-09-25 MED ORDER — FLUOXETINE HCL 10 MG PO CAPS
10.0000 mg | ORAL_CAPSULE | Freq: Every day | ORAL | Status: DC
  Administered 2021-09-25 – 2021-09-27 (×3): 10 mg via ORAL
  Filled 2021-09-25 (×3): qty 1

## 2021-09-25 MED ORDER — FLUTICASONE PROPIONATE 50 MCG/ACT NA SUSP
1.0000 | Freq: Every day | NASAL | Status: DC
Start: 2021-09-25 — End: 2021-09-27
  Administered 2021-09-25 – 2021-09-27 (×3): 1 via NASAL
  Filled 2021-09-25: qty 16

## 2021-09-25 NOTE — Progress Notes (Signed)
Patient ID: Shane Dickerson, male   DOB: 10/01/1996, 25 y.o.   MRN: [id number] Patient admitted early this morning for seizures.  I have reviewed patient's medical records including this morning's H&P, current vitals, labs and medications myself.  Continue current antiseizure medications.  Patient seen and examined at bedside.  I have consulted neurology: Await recommendations.  We will also consult psychiatry because patient is on a lot of medications for bipolar disorder.  Fall/seizure precautions.

## 2021-09-25 NOTE — Progress Notes (Signed)
EEG complete - results pending 

## 2021-09-25 NOTE — Plan of Care (Signed)
Neurology  VA level low at 11. Given his 2 seizures, will increase to 750mg  po tid. He will need another level in one week after discharge.   , MSN, APN-BC Neurology Nurse Practitioner Pager [phone]   Discussed with and approved by Dr. [phone].

## 2021-09-25 NOTE — Procedures (Signed)
Patient Name: Shane Dickerson  MRN: [id number]  Epilepsy Attending: Charlsie Quest  Referring Physician/Provider: Rica Mote, MD Date: 09/25/2021 Duration: 23.10 mins  Patient history: 25 yo M patient was brought from the group home after a witnessed tonic-clonic seizure lasting for 3 minutes then patient had another tonic-clonic episode lasting for 30 seconds in the ER. EEG to evaluate for seizure.   Level of alertness: Awake  AEDs during EEG study: LEV, VPA, OXC  Technical aspects: This EEG study was done with scalp electrodes positioned according to the 10-20 International system of electrode placement. Electrical activity was acquired at a sampling rate of 500Hz  and reviewed with a high frequency filter of 70Hz  and a low frequency filter of 1Hz . EEG data were recorded continuously and digitally stored.   Description: The posterior dominant rhythm consists of 8-9Hz  activity of moderate voltage (25-35 uV) seen predominantly in posterior head regions, symmetric and reactive to eye opening and eye closing. Hyperventilation and photic stimulation were not performed.     Of note, study was technically difficult due to significant electrode artifact.   IMPRESSION: This technically difficult study is within normal limits. No seizures or epileptiform discharges were seen throughout the recording.  If suspicion for ictal- interictal activity remains a concern, a prolonged study can be considered.   Trip Cavanagh 

## 2021-09-25 NOTE — H&P (Signed)
History and Physical    Shane Dickerson VEH:[id number] DOB: 1995/11/26 DOA: 09/24/2021  PCP: Patient, No Pcp Per (Inactive)  Patient coming from: Group home.  Chief Complaint: Seizures.  HPI: Shane Dickerson is a 25 y.o. male with history of autistic spectrum disorder, bipolar disorder was brought from the group home after he was witnessed to have a tonic-clonic seizure.  As per the report patient had a tonic-clonic seizure lasting for about 3 minutes.  When this happened patient was placed onto the floor.  Patient's respiratory rate slowed and he became cyanotic during the seizure.  After the seizure stopped patient came back to his baseline as per the report.  ED Course: In the ER patient had CT head and labs done.  Labs shows new macrocytic anemia.  Low magnesium levels.  CT of the head was unremarkable.  Neurologist on-call was consulted and plan was to increase patient's Trileptal dose from 450 mg 2 times daily to 600 mg twice daily and discharge back to group home and follow-up with neurologist.  But at that point patient had another tonic-clonic seizure lasting for 30 seconds.  Patient was given 1 dose of Ativan IV and neurology is recommending loading with Keppra and admitting for further observation.  Review of Systems: As per HPI, rest all negative.   Past Medical History:  Diagnosis Date   Allergy    seasonal allergies   Anxiety    Autism spectrum    Bipolar 1 disorder (HCC)    Insomnia     History reviewed. No pertinent surgical history.   reports that he has never smoked. He has never used smokeless tobacco. He reports that he does not drink alcohol and does not use drugs.  No Known Allergies  Family History  Family history unknown: Yes    Prior to Admission medications   Medication Sig Start Date End Date Taking? Authorizing Provider  benztropine (COGENTIN) 1 MG tablet Take 1 mg by mouth 2 (two) times daily.   Yes [provider]  cetirizine (ZYRTEC) 10  MG tablet Take 10 mg by mouth at bedtime.    Yes [provider]  divalproex (DEPAKOTE ER) 500 MG 24 hr tablet Take 500 mg by mouth 3 (three) times daily. 09/22/21  Yes [provider]  FLUoxetine (PROZAC) 10 MG capsule Take 10 mg by mouth daily.   Yes [provider]  fluticasone (VERAMYST) 27.5 MCG/SPRAY nasal spray Place 1 spray into the nose daily.    Yes [provider]  haloperidol (HALDOL) 5 MG tablet Take 5 mg by mouth 3 (three) times daily.   Yes [provider]  selenium sulfide (SELSUN) 2.5 % shampoo Apply 1 application topically every Saturday.   Yes [provider]  temazepam (RESTORIL) 15 MG capsule Take 15 mg by mouth at bedtime.   Yes [provider]  traZODone (DESYREL) 150 MG tablet Take 150 mg by mouth at bedtime. 09/19/21  Yes [provider]  Oxcarbazepine (TRILEPTAL) 300 MG tablet Take 2 tablets (600 mg total) by mouth 2 (two) times daily. 09/24/21 10/24/21  Kommor, Wyn Forster, MD    Physical Exam: Constitutional: Moderately built and nourished. Vitals:   09/24/21 2200 09/24/21 2215 09/25/21 0012 09/25/21 0025  BP: 116/84 (!) 149/126 122/78 121/76  Pulse: 64 (!) 107 96 96  Resp: 14 20 16 13   Temp:   (!) 97.4 F (36.3 C)   TempSrc:   Oral   SpO2: 100% 100% 100% 98%  Weight:  86 kg   Height:   5\' 11"  (1.803 m)    Eyes: Anicteric no pallor. ENMT: No discharge from the ears eyes nose and mouth. Neck: No mass felt.  No neck rigidity. Respiratory: No rhonchi or crepitations. Cardiovascular: S1-S2 heard. Abdomen: Soft nontender bowel sound present. Musculoskeletal: No edema. Skin: No rash. Neurologic: Patient was sleeping but arousable pupils are reactive to light.  Moving all extremities. Psychiatric: Patient has autistic disorder nonverbal.   Labs on Admission: I have personally reviewed following labs and imaging studies  CBC: Recent Labs  Lab 09/24/21 2008  WBC 7.5  NEUTROABS 5.2  HGB  9.8*  HCT 28.5*  MCV 100.4*  PLT 156   Basic Metabolic Panel: Recent Labs  Lab 09/24/21 2008  NA 135  K 3.6  CL 105  CO2 24  GLUCOSE 101*  BUN 8  CREATININE 0.66  CALCIUM 7.5*  MG 1.5*   GFR: Estimated Creatinine Clearance: 150.3 mL/min (by C-G formula based on SCr of 0.66 mg/dL). Liver Function Tests: Recent Labs  Lab 09/24/21 2008  AST 26  ALT 19  ALKPHOS 42  BILITOT 0.4  PROT 5.6*  ALBUMIN 3.0*   No results for input(s): LIPASE, AMYLASE in the last 168 hours. No results for input(s): AMMONIA in the last 168 hours. Coagulation Profile: No results for input(s): INR, PROTIME in the last 168 hours. Cardiac Enzymes: No results for input(s): CKTOTAL, CKMB, CKMBINDEX, TROPONINI in the last 168 hours. BNP (last 3 results) No results for input(s): PROBNP in the last 8760 hours. HbA1C: No results for input(s): HGBA1C in the last 72 hours. CBG: No results for input(s): GLUCAP in the last 168 hours. Lipid Profile: No results for input(s): CHOL, HDL, LDLCALC, TRIG, CHOLHDL, LDLDIRECT in the last 72 hours. Thyroid Function Tests: No results for input(s): TSH, T4TOTAL, FREET4, T3FREE, THYROIDAB in the last 72 hours. Anemia Panel: No results for input(s): VITAMINB12, FOLATE, FERRITIN, TIBC, IRON, RETICCTPCT in the last 72 hours. Urine analysis:    Component Value Date/Time   COLORURINE STRAW (A) 06/12/2020 1543   APPEARANCEUR CLEAR 06/12/2020 1543   LABSPEC 1.009 06/12/2020 1543   PHURINE 7.0 06/12/2020 1543   GLUCOSEU NEGATIVE 06/12/2020 1543   HGBUR NEGATIVE 06/12/2020 1543   BILIRUBINUR NEGATIVE 06/12/2020 1543   KETONESUR NEGATIVE 06/12/2020 1543   PROTEINUR NEGATIVE 06/12/2020 1543   NITRITE NEGATIVE 06/12/2020 1543   LEUKOCYTESUR NEGATIVE 06/12/2020 1543   Sepsis Labs: @LABRCNTIP (procalcitonin:4,lacticidven:4) ) Recent Results (from the past 240 hour(s))  Resp Panel by RT-PCR (Flu A&B, Covid) Nasopharyngeal Swab     Status: None   Collection Time:  09/24/21  8:59 PM   Specimen: Nasopharyngeal Swab; Nasopharyngeal(NP) swabs in vial transport medium  Result Value Ref Range Status   SARS Coronavirus 2 by RT PCR NEGATIVE NEGATIVE Final    Comment: (NOTE) SARS-CoV-2 target nucleic acids are NOT DETECTED.  The SARS-CoV-2 RNA is generally detectable in upper respiratory specimens during the acute phase of infection. The lowest concentration of SARS-CoV-2 viral copies this assay can detect is 138 copies/mL. A negative result does not preclude SARS-Cov-2 infection and should not be used as the sole basis for treatment or other patient management decisions. A negative result may occur with  improper specimen collection/handling, submission of specimen other than nasopharyngeal swab, presence of viral mutation(s) within the areas targeted by this assay, and inadequate number of viral copies(<138 copies/mL). A negative result must be combined with clinical observations, patient history, and epidemiological information. The expected result is Negative.  Fact Sheet for Patients:  BloggerCourse.com  Fact Sheet for Healthcare Providers:  SeriousBroker.it  This test is no t yet approved or cleared by the [place] FDA and  has been authorized for detection and/or diagnosis of SARS-CoV-2 by FDA under an Emergency Use Authorization (EUA). This EUA will remain  in effect (meaning this test can be used) for the duration of the COVID-19 declaration under Section 564(b)(1) of the Act, 21 U.S.C.section 360bbb-3(b)(1), unless the authorization is terminated  or revoked sooner.       Influenza A by PCR NEGATIVE NEGATIVE Final   Influenza B by PCR NEGATIVE NEGATIVE Final    Comment: (NOTE) The Xpert Xpress SARS-CoV-2/FLU/RSV plus assay is intended as an aid in the diagnosis of influenza from Nasopharyngeal swab specimens and should not be used as a sole basis for treatment. Nasal washings  and aspirates are unacceptable for Xpert Xpress SARS-CoV-2/FLU/RSV testing.  Fact Sheet for Patients: BloggerCourse.com  Fact Sheet for Healthcare Providers: SeriousBroker.it  This test is not yet approved or cleared by the [place] FDA and has been authorized for detection and/or diagnosis of SARS-CoV-2 by FDA under an Emergency Use Authorization (EUA). This EUA will remain in effect (meaning this test can be used) for the duration of the COVID-19 declaration under Section 564(b)(1) of the Act, 21 U.S.C. section 360bbb-3(b)(1), unless the authorization is terminated or revoked.  Performed at [facility], [address], [place], [place] [address]      Radiological Exams on Admission: CT Head Wo Contrast  Result Date: 09/24/2021 CLINICAL DATA:  New onset seizure. EXAM: CT HEAD WITHOUT CONTRAST TECHNIQUE: Contiguous axial images were obtained from the base of the skull through the vertex without intravenous contrast. COMPARISON:  Feb 03, 2020 FINDINGS: Brain: No evidence of acute infarction, hemorrhage, hydrocephalus, extra-axial collection or mass lesion/mass effect. Vascular: No hyperdense vessel or unexpected calcification. Skull: Normal. Negative for fracture or focal lesion. Sinuses/Orbits: No acute finding. Other: None. IMPRESSION: No acute intracranial pathology. Electronically Signed   By: Maudry Mayhew M.D.   On: 09/24/2021 20:08   DG Chest Portable 1 View  Result Date: 09/24/2021 CLINICAL DATA:  New onset seizure EXAM: PORTABLE CHEST 1 VIEW COMPARISON:  None. FINDINGS: The heart size and mediastinal contours are within normal limits. Both lungs are clear. The visualized skeletal structures are unremarkable. IMPRESSION: No active disease. Electronically Signed   By: Jasmine Pang M.D.   On: 09/24/2021 20:52      Assessment/Plan Principal Problem:   Seizure [facility]) Active Problems:   Autistic disorder     Seizures -patient was brought from the group home after a witnessed tonic-clonic seizure lasting for 3 minutes then patient had another tonic-clonic episode lasting for 30 seconds in the ER.  I have discussed with neurologist on-call Dr. Terrilee Files who at this time advised getting EEG and to keep patient on Keppra after loading dose for a milligram twice daily for 2 days and increase patient's Trileptal dose to 600 mg twice daily.  After 2 days Keppra can be discontinued.  If possible we will try to get an MRI brain.  But eventually will need 1. Bipolar disorder on Depakote levels are pending.  Patient is also on Prozac Restoril trazodone Haldol. Macrocytic anemia appears to be new when compared to blood work done in September 2021.  Will check stool for occult blood anemia panel follow CBC closely. Autistic disorder patient presently lives in a group home.   DVT prophylaxis: Lovenox. Code Status:  Full code. Family Communication: Discussed with patient. Disposition Plan: Home. Consults called: Discussed with neurologist. Admission status: Observation.   Eduard Clos MD [facility] Pager [phone].  If 7PM-7AM, please contact night-coverage [email or web address] Password [email or web address]  09/25/2021, 3:11 AM

## 2021-09-25 NOTE — Consult Note (Signed)
Neurology Consultation  Reason for Consult: Witnessed seizure at group home.  Referring Physician: Laurette Schimke, MD.   CC: witnessed seizure at group home.   History is obtained from: chart. No one at bedside.   HPI: Trig Mcbryar is a 25 y.o. male with a PMHx of autistic spectrum disorder, anxiety, insomnia, bipolar disorder, and new macrocytic anemia. Patient presented after having a 45 sec generalized seizure at group home. He had another seizure which lasted 3 minutes. He received Ativan. Patient was back to baseline after seizure. Neurologist on call last night gave advice on neurological treatment plan. Day attending asked for official consult.   Work up thus far shows new macrocytic disorder, and low magnesium. CTH without acute finding. MRI brain has not been accomplished due to patient's mental status.   Last pm, it was recommended patient receive a loading dose of Keppra, then maintenance dose x 2 days, then stop. His home Trileptal dose was increased to 600mg  po bid from 450mg  po bid.   We ordered EEG and MRI brain, both of which may not be able to be performed due to patient's mental status. Don't know if he will understand enough to lie still and keep his head straight.   ROS: Unable to obtain due to altered mental status.   Past Medical History:  Diagnosis Date   Allergy    seasonal allergies   Anxiety    Autism spectrum    Bipolar 1 disorder (HCC)    Insomnia     Family History  Family history unknown: Yes   Social History:   reports that he has never smoked. He has never used smokeless tobacco. He reports that he does not drink alcohol and does not use drugs.  Medications  Current Facility-Administered Medications:    acetaminophen (TYLENOL) tablet 650 mg, 650 mg, Oral, Q6H PRN **OR** acetaminophen (TYLENOL) suppository 650 mg, 650 mg, Rectal, Q6H PRN, , MD   benztropine (COGENTIN) tablet 1 mg, 1 mg, Oral, BID, , MD    divalproex (DEPAKOTE ER) 24 hr tablet 500 mg, 500 mg, Oral, TID, Eduard Clos, MD   enoxaparin (LOVENOX) injection 40 mg, 40 mg, Subcutaneous, Q24H, Eduard Clos, MD   FLUoxetine (PROZAC) capsule 10 mg, 10 mg, Oral, Daily, Eduard Clos, MD   fluticasone (FLONASE) 50 MCG/ACT nasal spray 1 spray, 1 spray, Each Nare, Daily, Eduard Clos, MD   haloperidol (HALDOL) tablet 5 mg, 5 mg, Oral, TID, Eduard Clos, MD   levETIRAcetam (KEPPRA) IVPB 500 mg/100 mL premix, 500 mg, Intravenous, Q12H, Eduard Clos, MD   loratadine (CLARITIN) tablet 10 mg, 10 mg, Oral, QHS, Eduard Clos, MD   Oxcarbazepine (TRILEPTAL) tablet 600 mg, 600 mg, Oral, BID, Eduard Clos, MD   [START ON 09/30/2021] selenium sulfide (SELSUN) 2.5 % shampoo 1 application, 1 application, Topical, Q Sat, Kakrakandy, Arshad N, MD   temazepam (RESTORIL) capsule 15 mg, 15 mg, Oral, QHS, 10/02/2021, MD   traZODone (DESYREL) tablet 150 mg, 150 mg, Oral, QHS, 07-01-1992, MD, 150 mg at 09/24/21 2005   Exam: Current vital signs: BP 105/62 (BP Location: Right Arm)    Pulse 70    Temp 98.4 F (36.9 C) (Axillary)    Resp 16    Ht 5\' 11"  (1.803 m)    Wt 86 kg    SpO2 95%    BMI 26.44 kg/m  Vital signs in last 24 hours: Temp:  [97.4  F (36.3 C)-98.4 F (36.9 C)] 98.4 F (36.9 C) (12/26 0400) Pulse Rate:  [61-107] 70 (12/26 0400) Resp:  [13-22] 16 (12/26 0400) BP: (103-149)/(62-126) 105/62 (12/26 0400) SpO2:  [95 %-100 %] 95 % (12/26 0400) Weight:  [86 kg] 86 kg (12/26 0012)  PE: GENERAL: Chronically ill appearing male. Awake, alert in NAD.  HEENT: normocephalic and atraumatic. LUNGS - Normal respiratory effort.  CV - RRR on tele. ABDOMEN - Soft, nontender. Ext: warm, well perfused. Psych: affect appropriate to situation.   NEURO:  Mental Status: Responds to tactile stimulation.  Speech/Language: Non comprehensible sounds. Cranial Nerves:  PERRL 3  mm/brisk. Lid elevation symmetric and full. Face is symmetrical at rest. Hearing intact to voice. Motor:  Moves all four extremities on command and spontaneously. He squeezed NPs fingers on command. No other strength testing able to be performed.  Tone is hypertonal in RUE. Bulk is normal.  Sensation- Intact to light touch bilaterally in all four extremities. Coordination: No tremors, jerking, shaking, or clonus noted.  Gait- deferred for safety.  Imaging  CT head No acute abnormality.   Assessment: 25 yo male with autism, bipolar, and known seizure disorder who presented from his group home yesterday after having 2 seizures. No further witnessed seizure activity noted. He is receiving Keppra x 2 days and Trileptal was increased. EEG and MRI brain will likely be unable to be done due to his mental status and movement in the bed. NP feels we can forego those tests as we already know is has seizures. So, if unable to perform testing, neurology will be available for questions.   Recommendations/Plan:  -Continue Keppra x total of 2 days and stop. -Continue increased dose of Trileptal at 600mg  po bid.  -Seizure precautions.  -Reach back out to if he has a clinical seizure.   Pt seen by [place], NP/Neuro and later by MD. Note/plan to be edited by MD as needed.  Pager: Jimmye Norman  Patient seen and examined. Chart reviewed and agree with assessment and plan as outlined in the note above.   Electronically signed by:  [id number], MD Page: Marisue Humble 09/25/2021, 6:30 PM

## 2021-09-26 DIAGNOSIS — R569 Unspecified convulsions: Secondary | ICD-10-CM | POA: Diagnosis not present

## 2021-09-26 NOTE — Progress Notes (Signed)
Patient ID: Shane Dickerson, male   DOB: 1995-12-28, 25 y.o.   MRN: [id number]  PROGRESS NOTE    Shane Dickerson  CNO:[id number] DOB: 06-03-1996 DOA: 09/24/2021 PCP: Patient, No Pcp Per (Inactive)   Brief Narrative:  25 y.o. male with history of autistic spectrum disorder, bipolar disorder was brought from the group home after he was witnessed to have a tonic-clonic seizure, apparently lasting for about 3 minutes.  On presentation, CT of the head was unremarkable.  Patient had another episode of tonic-clonic seizure lasting for 30 seconds in the ED.  He was started on IV Keppra and Trileptal dose was increased as per neurology recommendations.  Assessment & Plan:   New onset seizures -Patient apparently had 2 alcoholic seizure lasting for about 3 minutes in the group home and another episode lasting 30 seconds in the ED. -Neurology evaluation appreciated.  will continue IV Keppra till today.  Continue increased dose of Trileptal 600 mg twice a day.  Valproate level was 11: Depakote has been increased to 700 mg 3 times a day by neurology.  He will need another valproic level in a week after discharge. -EEG negative for seizures.  No further seizures since hospitalization. -PT eval. -Outpatient follow-up with neurology  Bipolar disorder -Continue current regimen.  Outpatient follow-up with psychiatry.  Spoke to on-call psychiatrist/Dr. Lolly Mustache on 09/25/2021 on phone who stated that patient does not need any change in his current psychotropic regimen and patient can follow-up with his regular outpatient provider and does not need inpatient psychiatry evaluation.  Autistic spectrum disorder -Outpatient follow-up  Leukocytosis -Mild.  Repeat a.m. labs  Hypomagnesemia -No labs today.  Repeat a.m. labs  Hyponatremia -mild.  Repeat a.m. labs  normocytic anemia  -Hemoglobin currently stable.  DVT prophylaxis: Lovenox Code Status: Full Family Communication: None at bedside Disposition  Plan: Status is: Inpatient  Remains inpatient appropriate because: Of need for antiepileptic medication adjustment  Consultants: Neurology  Procedures: EEG  Antimicrobials: None   Subjective: Patient seen and examined at bedside.  Awake, does not answer any questions.  No overnight fever, vomiting or seizures reported.  Objective: Vitals:   09/26/21 0400 09/26/21 0500 09/26/21 0600 09/26/21 0802  BP: (!) 85/48   102/61  Pulse:  (!) 57 (!) 52 65  Resp:  17 16 14   Temp: 98.3 F (36.8 C)   (!) 97.1 F (36.2 C)  TempSrc: Oral   Oral  SpO2:  97% 97% 96%  Weight:      Height:        Intake/Output Summary (Last 24 hours) at 09/26/2021 1009 Last data filed at 09/25/2021 2050 Gross per 24 hour  Intake 100 ml  Output 700 ml  Net -600 ml   Filed Weights   09/25/21 0012  Weight: 86 kg    Examination:  General exam: Appears calm and comfortable.  Currently on room air.  Awake, does not answer any questions.  No seizures noted. Respiratory system: Bilateral decreased breath sounds at bases Cardiovascular system: S1 & S2 heard, Rate controlled Gastrointestinal system: Abdomen is nondistended, soft and nontender. Normal bowel sounds heard. Extremities: No cyanosis, clubbing, edema   Data Reviewed: I have personally reviewed following labs and imaging studies  CBC: Recent Labs  Lab 09/24/21 2008 09/25/21 0408  WBC 7.5 11.8*  NEUTROABS 5.2  --   HGB 9.8* 10.7*  HCT 28.5* 31.2*  MCV 100.4* 96.9  PLT 156 196   Basic Metabolic Panel: Recent Labs  Lab 09/24/21 2008 09/25/21 0408  NA 135 132*  K 3.6 3.8  CL 105 97*  CO2 24 27  GLUCOSE 101* 118*  BUN 8 7  CREATININE 0.66 0.53*  CALCIUM 7.5* 8.7*  MG 1.5*  --    GFR: Estimated Creatinine Clearance: 150.3 mL/min (A) (by C-G formula based on SCr of 0.53 mg/dL (L)). Liver Function Tests: Recent Labs  Lab 09/24/21 2008 09/25/21 0408  AST 26 23  ALT 19 21  ALKPHOS 42 44  BILITOT 0.4 0.4  PROT 5.6* 6.3*   ALBUMIN 3.0* 3.3*   No results for input(s): LIPASE, AMYLASE in the last 168 hours. No results for input(s): AMMONIA in the last 168 hours. Coagulation Profile: No results for input(s): INR, PROTIME in the last 168 hours. Cardiac Enzymes: No results for input(s): CKTOTAL, CKMB, CKMBINDEX, TROPONINI in the last 168 hours. BNP (last 3 results) No results for input(s): PROBNP in the last 8760 hours. HbA1C: No results for input(s): HGBA1C in the last 72 hours. CBG: Recent Labs  Lab 09/25/21 0315  GLUCAP 126*   Lipid Profile: No results for input(s): CHOL, HDL, LDLCALC, TRIG, CHOLHDL, LDLDIRECT in the last 72 hours. Thyroid Function Tests: No results for input(s): TSH, T4TOTAL, FREET4, T3FREE, THYROIDAB in the last 72 hours. Anemia Panel: Recent Labs    09/25/21 0408  VITAMINB12 608  FOLATE 14.1  FERRITIN 215  TIBC 326  IRON 31*  RETICCTPCT 3.7*   Sepsis Labs: No results for input(s): PROCALCITON, LATICACIDVEN in the last 168 hours.  Recent Results (from the past 240 hour(s))  Resp Panel by RT-PCR (Flu A&B, Covid) Nasopharyngeal Swab     Status: None   Collection Time: 09/24/21  8:59 PM   Specimen: Nasopharyngeal Swab; Nasopharyngeal(NP) swabs in vial transport medium  Result Value Ref Range Status   SARS Coronavirus 2 by RT PCR NEGATIVE NEGATIVE Final    Comment: (NOTE) SARS-CoV-2 target nucleic acids are NOT DETECTED.  The SARS-CoV-2 RNA is generally detectable in upper respiratory specimens during the acute phase of infection. The lowest concentration of SARS-CoV-2 viral copies this assay can detect is 138 copies/mL. A negative result does not preclude SARS-Cov-2 infection and should not be used as the sole basis for treatment or other patient management decisions. A negative result may occur with  improper specimen collection/handling, submission of specimen other than nasopharyngeal swab, presence of viral mutation(s) within the areas targeted by this assay, and  inadequate number of viral copies(<138 copies/mL). A negative result must be combined with clinical observations, patient history, and epidemiological information. The expected result is Negative.  Fact Sheet for Patients:  BloggerCourse.com  Fact Sheet for Healthcare Providers:  SeriousBroker.it  This test is no t yet approved or cleared by the [place] FDA and  has been authorized for detection and/or diagnosis of SARS-CoV-2 by FDA under an Emergency Use Authorization (EUA). This EUA will remain  in effect (meaning this test can be used) for the duration of the COVID-19 declaration under Section 564(b)(1) of the Act, 21 U.S.C.section 360bbb-3(b)(1), unless the authorization is terminated  or revoked sooner.       Influenza A by PCR NEGATIVE NEGATIVE Final   Influenza B by PCR NEGATIVE NEGATIVE Final    Comment: (NOTE) The Xpert Xpress SARS-CoV-2/FLU/RSV plus assay is intended as an aid in the diagnosis of influenza from Nasopharyngeal swab specimens and should not be used as a sole basis for treatment. Nasal washings and aspirates are unacceptable for Xpert Xpress SARS-CoV-2/FLU/RSV testing.  Fact Sheet for Patients: BloggerCourse.com  Fact Sheet for Healthcare Providers: SeriousBroker.it  This test is not yet approved or cleared by the [place] FDA and has been authorized for detection and/or diagnosis of SARS-CoV-2 by FDA under an Emergency Use Authorization (EUA). This EUA will remain in effect (meaning this test can be used) for the duration of the COVID-19 declaration under Section 564(b)(1) of the Act, 21 U.S.C. section 360bbb-3(b)(1), unless the authorization is terminated or revoked.  Performed at [facility], [address], [place], [place] [address]          Radiology Studies: CT Head Wo Contrast  Result Date: 09/24/2021 CLINICAL  DATA:  New onset seizure. EXAM: CT HEAD WITHOUT CONTRAST TECHNIQUE: Contiguous axial images were obtained from the base of the skull through the vertex without intravenous contrast. COMPARISON:  Feb 03, 2020 FINDINGS: Brain: No evidence of acute infarction, hemorrhage, hydrocephalus, extra-axial collection or mass lesion/mass effect. Vascular: No hyperdense vessel or unexpected calcification. Skull: Normal. Negative for fracture or focal lesion. Sinuses/Orbits: No acute finding. Other: None. IMPRESSION: No acute intracranial pathology. Electronically Signed   By: Maudry Mayhew M.D.   On: 09/24/2021 20:08   DG Chest Portable 1 View  Result Date: 09/24/2021 CLINICAL DATA:  New onset seizure EXAM: PORTABLE CHEST 1 VIEW COMPARISON:  None. FINDINGS: The heart size and mediastinal contours are within normal limits. Both lungs are clear. The visualized skeletal structures are unremarkable. IMPRESSION: No active disease. Electronically Signed   By: Jasmine Pang M.D.   On: 09/24/2021 20:52   EEG adult  Result Date: 09/25/2021 Charlsie Quest, MD     09/25/2021 12:53 PM Patient Name: Caysen Whang MRN: [id number] Epilepsy Attending: Charlsie Quest Referring Physician/Provider: Rica Mote, MD Date: 09/25/2021 Duration: 23.10 mins Patient history: 25 yo M patient was brought from the group home after a witnessed tonic-clonic seizure lasting for 3 minutes then patient had another tonic-clonic episode lasting for 30 seconds in the ER. EEG to evaluate for seizure. Level of alertness: Awake AEDs during EEG study: LEV, VPA, OXC Technical aspects: This EEG study was done with scalp electrodes positioned according to the 10-20 International system of electrode placement. Electrical activity was acquired at a sampling rate of 500Hz  and reviewed with a high frequency filter of 70Hz  and a low frequency filter of 1Hz . EEG data were recorded continuously and digitally stored. Description: The posterior dominant rhythm  consists of 8-9Hz  activity of moderate voltage (25-35 uV) seen predominantly in posterior head regions, symmetric and reactive to eye opening and eye closing. Hyperventilation and photic stimulation were not performed.   Of note, study was technically difficult due to significant electrode artifact. IMPRESSION: This technically difficult study is within normal limits. No seizures or epileptiform discharges were seen throughout the recording. If suspicion for ictal- interictal activity remains a concern, a prolonged study can be considered. Priyanka        Scheduled Meds:  benztropine  1 mg Oral BID   divalproex  750 mg Oral TID   enoxaparin (LOVENOX) injection  40 mg Subcutaneous Q24H   FLUoxetine  10 mg Oral Daily   fluticasone  1 spray Each Nare Daily   haloperidol  5 mg Oral TID   loratadine  10 mg Oral QHS   Oxcarbazepine  600 mg Oral BID   [START ON 09/30/2021] selenium sulfide  1 application Topical Q Sat   temazepam  15 mg Oral QHS   traZODone  150 mg Oral QHS   Continuous  Infusions:  levETIRAcetam 500 mg (09/26/21 9326)          Glade Lloyd, MD [facility] 09/26/2021, 10:09 AM

## 2021-09-26 NOTE — Plan of Care (Signed)
Impaired cognition

## 2021-09-26 NOTE — Plan of Care (Signed)

## 2021-09-26 NOTE — Progress Notes (Signed)
°  Transition of Care [facility]) Screening Note   Patient Details  Name: Shane Dickerson Date of Birth: 1995/10/23   [facility]) CM/SW Contact:    Kermit Balo, RN Phone Number: 09/26/2021, 2:36 PM    Transition of Care Department [facility]) has reviewed patient. We will continue to monitor patient advancement through interdisciplinary progression rounds. If new patient transition needs arise, please place a TOC consult.

## 2021-09-26 NOTE — Plan of Care (Signed)
Pt has been cooperative and following commands. No episodes of seizures at this time. Cond. Cath in place. Pt has been feeding his self and eating well. Takes his meds with no problems.  Problem: Education: Goal: Knowledge of General Education information will improve Description: Including pain rating scale, medication(s)/side effects and non-pharmacologic comfort measures Outcome: Progressing   Problem: Health Behavior/Discharge Planning: Goal: Ability to manage health-related needs will improve Outcome: Progressing   Problem: Clinical Measurements: Goal: Ability to maintain clinical measurements within normal limits will improve Outcome: Progressing Goal: Will remain free from infection Outcome: Progressing Goal: Diagnostic test results will improve Outcome: Progressing Goal: Respiratory complications will improve Outcome: Progressing Goal: Cardiovascular complication will be avoided Outcome: Progressing   Problem: Activity: Goal: Risk for activity intolerance will decrease Outcome: Progressing   Problem: Nutrition: Goal: Adequate nutrition will be maintained Outcome: Progressing   Problem: Coping: Goal: Level of anxiety will decrease Outcome: Progressing   Problem: Elimination: Goal: Will not experience complications related to bowel motility Outcome: Progressing Goal: Will not experience complications related to urinary retention Outcome: Progressing   Problem: Pain Managment: Goal: General experience of comfort will improve Outcome: Progressing   Problem: Safety: Goal: Ability to remain free from injury will improve Outcome: Progressing   Problem: Skin Integrity: Goal: Risk for impaired skin integrity will decrease Outcome: Progressing   Problem: Education: Goal: Expressions of having a comfortable level of knowledge regarding the disease process will increase Outcome: Progressing   Problem: Coping: Goal: Ability to adjust to condition or change in  health will improve Outcome: Progressing Goal: Ability to identify appropriate support needs will improve Outcome: Progressing   Problem: Health Behavior/Discharge Planning: Goal: Compliance with prescribed medication regimen will improve Outcome: Progressing   Problem: Medication: Goal: Risk for medication side effects will decrease Outcome: Progressing   Problem: Clinical Measurements: Goal: Complications related to the disease process, condition or treatment will be avoided or minimized Outcome: Progressing Goal: Diagnostic test results will improve Outcome: Progressing   Problem: Safety: Goal: Verbalization of understanding the information provided will improve Outcome: Progressing   Problem: Self-Concept: Goal: Level of anxiety will decrease Outcome: Progressing Goal: Ability to verbalize feelings about condition will improve Outcome: Progressing

## 2021-09-26 NOTE — Evaluation (Signed)
Physical Therapy Evaluation Patient Details Name: Shane Dickerson MRN: [id number] DOB: 09/25/1996 Today's Date: 09/26/2021  History of Present Illness  Pt is a 25 y/o male admitted 12/25 secondary to seizures. CT negative. PMH includes autism and bipolar disorder.  Clinical Impression  Pt admitted secondary to problem above with deficits below. Pt nonverbal, but able to follow commands throughout session. Pt requiring min guard A for transfers and gait this session. No overt LOB noted. Anticipate pt will progress well and will not require follow up PT. Will continue to follow acutely.        Recommendations for follow up therapy are one component of a multi-disciplinary discharge planning process, led by the attending physician.  Recommendations may be updated based on patient status, additional functional criteria and insurance authorization.  Follow Up Recommendations No PT follow up    Assistance Recommended at Discharge Frequent or constant Supervision/Assistance  Functional Status Assessment Patient has had a recent decline in their functional status and demonstrates the ability to make significant improvements in function in a reasonable and predictable amount of time.  Equipment Recommendations  None recommended by PT    Recommendations for Other Services       Precautions / Restrictions Precautions Precautions: Fall Restrictions Weight Bearing Restrictions: No      Mobility  Bed Mobility Overal bed mobility: Modified Independent                  Transfers Overall transfer level: Needs assistance Equipment used: None Transfers: Sit to/from Stand Sit to Stand: Min guard           General transfer comment: Min guard for safety    Ambulation/Gait Ambulation/Gait assistance: Min guard Gait Distance (Feet): 50 Feet Assistive device: None Gait Pattern/deviations: Step-through pattern;Decreased stride length Gait velocity: Decreased     General Gait  Details: Min guard for safety. No overt LOB noted.  Stairs            Wheelchair Mobility    Modified Rankin (Stroke Patients Only)       Balance Overall balance assessment: Needs assistance Sitting-balance support: No upper extremity supported;Feet supported Sitting balance-Leahy Scale: Good     Standing balance support: No upper extremity supported Standing balance-Leahy Scale: Fair                               Pertinent Vitals/Pain Pain Assessment: Faces Faces Pain Scale: No hurt    Home Living Family/patient expects to be discharged to:: Group home                        Prior Function Prior Level of Function : Patient poor historian/Family not available             Mobility Comments: Unsure of baseline as pt unable to report       Hand Dominance        Extremity/Trunk Assessment   Upper Extremity Assessment Upper Extremity Assessment: Overall WFL for tasks assessed    Lower Extremity Assessment Lower Extremity Assessment: Generalized weakness    Cervical / Trunk Assessment Cervical / Trunk Assessment: Normal  Communication   Communication: Expressive difficulties (nonverbal at baseline)  Cognition Arousal/Alertness: Awake/alert Behavior During Therapy: WFL for tasks assessed/performed Overall Cognitive Status: Difficult to assess  General Comments: Able to follow simple commands        General Comments General comments (skin integrity, edema, etc.): No caregiver present    Exercises     Assessment/Plan    PT Assessment Patient needs continued PT services  PT Problem List Decreased strength;Decreased mobility;Decreased activity tolerance;Decreased balance       PT Treatment Interventions Gait training;Functional mobility training;Therapeutic activities;Therapeutic exercise;Balance training;Patient/family education    PT Goals (Current goals can be found in the  Care Plan section)  Acute Rehab PT Goals PT Goal Formulation: Patient unable to participate in goal setting Time For Goal Achievement: 10/10/21 Potential to Achieve Goals: Good    Frequency Min 3X/week   Barriers to discharge        Co-evaluation               AM-PAC PT "6 Clicks" Mobility  Outcome Measure Help needed turning from your back to your side while in a flat bed without using bedrails?: None Help needed moving from lying on your back to sitting on the side of a flat bed without using bedrails?: None Help needed moving to and from a bed to a chair (including a wheelchair)?: A Little Help needed standing up from a chair using your arms (e.g., wheelchair or bedside chair)?: A Little Help needed to walk in hospital room?: A Little Help needed climbing 3-5 steps with a railing? : A Lot 6 Click Score: 19    End of Session Equipment Utilized During Treatment: Gait belt Activity Tolerance: Patient tolerated treatment well Patient left: in bed;with call bell/phone within reach;with bed alarm set Nurse Communication: Mobility status PT Visit Diagnosis: Muscle weakness (generalized) (M62.81);Other abnormalities of gait and mobility (R26.89)    Time: 1443-1540 PT Time Calculation (min) (ACUTE ONLY): 21 min   Charges:   PT Evaluation $PT Eval Moderate Complexity: 1 Mod          Farley Ly, PT, DPT  Acute Rehabilitation Services  Pager: [phone] Office: [phone]   Le[facility] Prom 09/26/2021, 1:49 PM

## 2021-09-27 DIAGNOSIS — R569 Unspecified convulsions: Secondary | ICD-10-CM | POA: Diagnosis not present

## 2021-09-27 LAB — BASIC METABOLIC PANEL
Anion gap: 10 (ref 5–15)
BUN: 15 mg/dL (ref 6–20)
CO2: 25 mmol/L (ref 22–32)
Calcium: 8.7 mg/dL — ABNORMAL LOW (ref 8.9–10.3)
Chloride: 103 mmol/L (ref 98–111)
Creatinine, Ser: 0.76 mg/dL (ref 0.61–1.24)
GFR, Estimated: >60 mL/min (ref >60)
Glucose, Bld: 84 mg/dL (ref 70–99)
Potassium: 3.7 mmol/L (ref 3.5–5.1)
Sodium: 138 mmol/L (ref 135–145)

## 2021-09-27 LAB — CBC WITH DIFFERENTIAL/PLATELET
Abs Immature Granulocytes: 0.02 10*3/uL (ref 0.00–0.07)
Basophils Absolute: 0 10*3/uL (ref 0.0–0.1)
Basophils Relative: 0 %
Eosinophils Absolute: 0 10*3/uL (ref 0.0–0.5)
Eosinophils Relative: 0 %
HCT: 31.2 % — ABNORMAL LOW (ref 39.0–52.0)
Hemoglobin: 10.6 g/dL — ABNORMAL LOW (ref 13.0–17.0)
Immature Granulocytes: 0 %
Lymphocytes Relative: 31 %
Lymphs Abs: 2.4 10*3/uL (ref 0.7–4.0)
MCH: 33.5 pg (ref 26.0–34.0)
MCHC: 34 g/dL (ref 30.0–36.0)
MCV: 98.7 fL (ref 80.0–100.0)
Monocytes Absolute: 0.8 10*3/uL (ref 0.1–1.0)
Monocytes Relative: 11 %
Neutro Abs: 4.5 10*3/uL (ref 1.7–7.7)
Neutrophils Relative %: 58 %
Platelets: 192 10*3/uL (ref 150–400)
RBC: 3.16 MIL/uL — ABNORMAL LOW (ref 4.22–5.81)
RDW: 12.6 % (ref 11.5–15.5)
WBC: 7.7 10*3/uL (ref 4.0–10.5)
nRBC: 0 % (ref 0.0–0.2)

## 2021-09-27 LAB — MAGNESIUM: Magnesium: 1.9 mg/dL (ref 1.7–2.4)

## 2021-09-27 MED ORDER — OXCARBAZEPINE 300 MG PO TABS: 600.0000 mg | ORAL_TABLET | Freq: Two times a day (BID) | ORAL | 0 refills | Status: DC

## 2021-09-27 MED ORDER — DIVALPROEX SODIUM ER 250 MG PO TB24: 750.0000 mg | ORAL_TABLET | Freq: Three times a day (TID) | ORAL | 0 refills | Status: DC

## 2021-09-27 NOTE — Discharge Summary (Signed)
Physician Discharge Summary  Kamdin Follett ZOX:[id number] DOB: [birth year] DOA: 09/24/2021  PCP: Patient, No Pcp Per (Inactive)  Admit date: 09/24/2021 Discharge date: 09/27/2021  Admitted From: Group home Discharge disposition: Back to group home   Code Status: Full Code  Diet Recommendation: Regular diet  Discharge Diagnosis:   Principal Problem:   Seizure (HCC) Active Problems:   Autistic disorder   History of Present Illness / Brief narrative:  25 y.o. male with history of autistic spectrum disorder, bipolar disorder was brought from the group home after he was witnessed to have a tonic-clonic seizure, apparently lasting for about 3 minutes.  On presentation, CT of the head was unremarkable.  Patient had another episode of tonic-clonic seizure lasting for 30 seconds in the ED.  He was started on IV Keppra and Trileptal dose was increased as per neurology recommendations.  Subjective:  Seen and examined this morning.  Young Caucasian male.  Not in distress.  Hospital Course:  New onset seizures -Patient apparently had 2 seizures lasting for about 3 minutes in the group home and another episode lasting 30 seconds in the ED. -Neurology evaluation appreciated.  Keppra was given for 2 days and stopped.  Prior to admission he was on Trileptal.  Neurology increased the dose to 600 mg twice daily.  Valproic acid level was noted to be low at 11, probably contributing to seizure breakthrough. Depakote level was increased to 750 mg. Per neurology note, will need another level of Depakote a week after discharge. -Outpatient follow-up with neurology   Bipolar disorder -Continue current regimen. Outpatient follow-up with psychiatry.  Previous hospitalist spoke to on-call psychiatrist/Dr. Lolly Mustache on 09/25/2021 on phone who stated that patient does not need any inpatient psychiatric evaluation or change in his current psychotropic regimen and patient can follow-up with his regular outpatient  provider.   Autistic spectrum disorder -Supportive care.  Outpatient follow-up   Leukocytosis -Mild.  Resolved  Hypomagnesemia -Improved with replacement Recent Labs  Lab 09/24/21 2008 09/25/21 0408 09/27/21 0246  K 3.6 3.8 3.7  MG 1.5*  --  1.9   Hyponatremia -mild.  Improved. Recent Labs  Lab 09/24/21 2008 09/25/21 0408 09/27/21 0246  NA 135 132* 138   Chronic macrocytic anemia -Stable hemoglobin.  Normal folate and vitamin B12 level. Recent Labs    09/24/21 2008 09/25/21 0408 09/27/21 0246  HGB 9.8* 10.7* 10.6*  MCV 100.4* 96.9 98.7  VITAMINB12  --  608  --   FOLATE  --  14.1  --   FERRITIN  --  215  --   TIBC  --  326  --   IRON  --  31*  --   RETICCTPCT  --  3.7*  --    Stable for discharge back to group home today  Discharge Medications:   Allergies as of 09/27/2021   No Known Allergies      Medication List     TAKE these medications    benztropine 1 MG tablet Commonly known as: COGENTIN Take 1 mg by mouth 2 (two) times daily.   cetirizine 10 MG tablet Commonly known as: ZYRTEC Take 10 mg by mouth at bedtime.   divalproex 250 MG 24 hr tablet Commonly known as: DEPAKOTE ER Take 3 tablets (750 mg total) by mouth 3 (three) times daily. What changed:  medication strength how much to take   FLUoxetine 10 MG capsule Commonly known as: PROZAC Take 10 mg by mouth daily.   fluticasone 27.5 MCG/SPRAY nasal spray Commonly  known as: VERAMYST Place 1 spray into the nose daily.   haloperidol 5 MG tablet Commonly known as: HALDOL Take 5 mg by mouth 3 (three) times daily.   Oxcarbazepine 300 MG tablet Commonly known as: TRILEPTAL Take 2 tablets (600 mg total) by mouth 2 (two) times daily. What changed: how much to take   selenium sulfide 2.5 % shampoo Commonly known as: SELSUN Apply 1 application topically every Saturday.   temazepam 15 MG capsule Commonly known as: RESTORIL Take 15 mg by mouth at bedtime.   traZODone 150 MG  tablet Commonly known as: DESYREL Take 150 mg by mouth at bedtime.        Wound care:     Discharge Instructions:   Discharge Instructions     Call MD for:  difficulty breathing, headache or visual disturbances   Complete by: As directed    Call MD for:  extreme fatigue   Complete by: As directed    Call MD for:  hives   Complete by: As directed    Call MD for:  persistant dizziness or light-headedness   Complete by: As directed    Call MD for:  persistant nausea and vomiting   Complete by: As directed    Call MD for:  severe uncontrolled pain   Complete by: As directed    Call MD for:  temperature >100.4   Complete by: As directed    Diet general   Complete by: As directed    Discharge instructions   Complete by: As directed    Seizure precautions: -Per [facility] statutes, patients with seizures are not allowed to drive until they have been seizure-free for six months.  -Use caution when using heavy equipment or power tools.  -Avoid working on ladders or at heights.  -Take showers instead of baths. Ensure the water temperature is not too high on the home water heater.  -Do not go swimming alone.  -Do not lock yourself in a room alone (i.e. bathroom). -When caring for infants or small children, sit down when holding, feeding, or changing them to minimize risk of injury to the child in the event you have a seizure.  -Maintain good sleep hygiene. -Avoid alcohol.    If patient has another seizure, call 911 and bring them back to the ED if: A.  The seizure lasts longer than 5 minutes.      B.  The patient doesn't wake shortly after the seizure or has new problems such as difficulty seeing, speaking or moving following the seizure C.  The patient was injured during the seizure D.  The patient has a temperature over 102 F (39C) E.  The patient vomited during the seizure and now is having trouble breathing    General discharge instructions:  Follow with  Primary MD Patient, No Pcp Per (Inactive) in 7 days   Get CBC/BMP checked in next visit within 1 week by PCP or SNF MD. (We routinely change or add medications that can affect your baseline labs and fluid status, therefore we recommend that you get the mentioned basic workup next visit with your PCP, your PCP may decide not to get them or add new tests based on their clinical decision)  On your next visit with your PCP, please get your medicines reviewed and adjusted.  Please request your PCP  to go over all hospital tests, procedures, radiology results at the follow up, please get all Hospital records sent to your PCP by signing hospital  release before you go home.  Activity: As tolerated with Full fall precautions use walker/cane & assistance as needed  Avoid using any recreational substances like cigarette, tobacco, alcohol, or non-prescribed drug.  If you experience worsening of your admission symptoms, develop shortness of breath, life threatening emergency, suicidal or homicidal thoughts you must seek medical attention immediately by calling 911 or calling your MD immediately  if symptoms less severe.  You must read complete instructions/literature along with all the possible adverse reactions/side effects for all the medicines you take and that have been prescribed to you. Take any new medicine only after you have completely understood and accepted all the possible adverse reactions/side effects.   Do not drive, operate heavy machinery, perform activities at heights, swimming or participation in water activities or provide baby sitting services if your were admitted for syncope or siezures until you have seen by Primary MD or a Neurologist and advised to do so again.  Do not drive when taking Pain medications.  Do not take more than prescribed Pain, Sleep and Anxiety Medications  Wear Seat belts while driving.  Please note You were cared for by a hospitalist during your hospital stay.  If you have any questions about your discharge medications or the care you received while you were in the hospital after you are discharged, you can call the unit and asked to speak with the hospitalist on call if the hospitalist that took care of you is not available. Once you are discharged, your primary care physician will handle any further medical issues. Please note that NO REFILLS for any discharge medications will be authorized once you are discharged, as it is imperative that you return to your primary care physician (or establish a relationship with a primary care physician if you do not have one) for your aftercare needs so that they can reassess your need for medications and monitor your lab values.   Increase activity slowly   Complete by: As directed        Follow ups:    Follow-up Information     [facility]. Schedule an appointment as soon as possible for a visit .   Contact information: [address] [place], [address] [place] [address] [phone]        [facility] Follow up.   Contact information: [address] [place] [address] [phone]                Discharge Exam:   Vitals:   09/26/21 2001 09/27/21 0029 09/27/21 0418 09/27/21 0733  BP: 102/69 99/64 (!) 97/58 (!) 91/55  Pulse: 89 68 63 62  Resp: 17 16 16 16   Temp: 97.7 F (36.5 C) 98.3 F (36.8 C) 98 F (36.7 C) 97.8 F (36.6 C)  TempSrc: Oral Oral  Oral  SpO2: 97% 96% 96% 97%  Weight:      Height:        Body mass index is 26.44 kg/m.  General exam: Young Caucasian male.  Not in distress Skin: No rashes, lesions or ulcers. HEENT: Atraumatic, normocephalic, no obvious bleeding Lungs: Clear to auscultate bilaterally CVS: Regular rate and rhythm, no murmur GI/Abd soft, nontender, nondistended, bowel sound present CNS: Baseline cognitive impairment, currently at baseline Psychiatry: Mood  appropriate Extremities: No pedal edema, no calf tenderness  Time coordinating discharge: 35 minutes   The results of significant diagnostics from this hospitalization (including imaging, microbiology, ancillary and laboratory) are listed below for reference.  Procedures and Diagnostic Studies:   CT Head Wo Contrast  Result Date: 09/24/2021 CLINICAL DATA:  New onset seizure. EXAM: CT HEAD WITHOUT CONTRAST TECHNIQUE: Contiguous axial images were obtained from the base of the skull through the vertex without intravenous contrast. COMPARISON:  Feb 03, 2020 FINDINGS: Brain: No evidence of acute infarction, hemorrhage, hydrocephalus, extra-axial collection or mass lesion/mass effect. Vascular: No hyperdense vessel or unexpected calcification. Skull: Normal. Negative for fracture or focal lesion. Sinuses/Orbits: No acute finding. Other: None. IMPRESSION: No acute intracranial pathology. Electronically Signed   By: Maudry Mayhew M.D.   On: 09/24/2021 20:08   DG Chest Portable 1 View  Result Date: 09/24/2021 CLINICAL DATA:  New onset seizure EXAM: PORTABLE CHEST 1 VIEW COMPARISON:  None. FINDINGS: The heart size and mediastinal contours are within normal limits. Both lungs are clear. The visualized skeletal structures are unremarkable. IMPRESSION: No active disease. Electronically Signed   By: Jasmine Pang M.D.   On: 09/24/2021 20:52   EEG adult  Result Date: 09/25/2021 Charlsie Quest, MD     09/25/2021 12:53 PM Patient Name: Shane Dickerson MRN: [id number] Epilepsy Attending: Charlsie Quest Referring Physician/Provider: Rica Mote, MD Date: 09/25/2021 Duration: 23.10 mins Patient history: 25 yo M patient was brought from the group home after a witnessed tonic-clonic seizure lasting for 3 minutes then patient had another tonic-clonic episode lasting for 30 seconds in the ER. EEG to evaluate for seizure. Level of alertness: Awake AEDs during EEG study: LEV, VPA, OXC Technical aspects: This  EEG study was done with scalp electrodes positioned according to the 10-20 International system of electrode placement. Electrical activity was acquired at a sampling rate of  and reviewed with a high frequency filter of  and a low frequency filter of . EEG data were recorded continuously and digitally stored. Description: The posterior dominant rhythm consists of 8-9Hz  activity of moderate voltage (25-35 uV) seen predominantly in posterior head regions, symmetric and reactive to eye opening and eye closing. Hyperventilation and photic stimulation were not performed.   Of note, study was technically difficult due to significant electrode artifact. IMPRESSION: This technically difficult study is within normal limits. No seizures or epileptiform discharges were seen throughout the recording. If suspicion for ictal- interictal activity remains a concern, a prolonged study can be considered. Charlsie Quest     Labs:   Basic Metabolic Panel: Recent Labs  Lab 09/24/21 2008 09/25/21 0408 09/27/21 0246  NA 135 132* 138  K 3.6 3.8 3.7  CL 105 97* 103  CO2 GLUCOSE 101* 118* 84  BUN CREATININE 0.66 0.53* 0.76  CALCIUM 7.5* 8.7* 8.7*  MG 1.5*  --  1.9   GFR Estimated Creatinine Clearance: 150.3 mL/min (by C-G formula based on SCr of 0.76 mg/dL). Liver Function Tests: Recent Labs  Lab 09/24/21 2008 09/25/21 0408  AST 26 23  ALT 19 21  ALKPHOS 42 44  BILITOT 0.4 0.4  PROT 5.6* 6.3*  ALBUMIN 3.0* 3.3*   No results for input(s): LIPASE, AMYLASE in the last 168 hours. No results for input(s): AMMONIA in the last 168 hours. Coagulation profile No results for input(s): INR, PROTIME in the last 168 hours.  CBC: Recent Labs  Lab 09/24/21 2008 09/25/21 0408 09/27/21 0246  WBC 7.5 11.8* 7.7  NEUTROABS 5.2  --  4.5  HGB 9.8* 10.7* 10.6*  HCT 28.5* 31.2* 31.2*  MCV 100.4* 96.9 98.7  PLT 156 196 192  Cardiac Enzymes: No results for input(s): CKTOTAL,  CKMB, CKMBINDEX, TROPONINI in the last 168 hours. BNP: Invalid input(s): POCBNP CBG: Recent Labs  Lab 09/25/21 0315  GLUCAP 126*   D-Dimer No results for input(s): DDIMER in the last 72 hours. Hgb A1c No results for input(s): HGBA1C in the last 72 hours. Lipid Profile No results for input(s): CHOL, HDL, LDLCALC, TRIG, CHOLHDL, LDLDIRECT in the last 72 hours. Thyroid function studies No results for input(s): TSH, T4TOTAL, T3FREE, THYROIDAB in the last 72 hours.  Invalid input(s): FREET3 Anemia work up Recent Labs    09/25/21 0408  VITAMINB12 608  FOLATE 14.1  FERRITIN 215  TIBC 326  IRON 31*  RETICCTPCT 3.7*   Microbiology Recent Results (from the past 240 hour(s))  Resp Panel by RT-PCR (Flu A&B, Covid) Nasopharyngeal Swab     Status: None   Collection Time: 09/24/21  8:59 PM   Specimen: Nasopharyngeal Swab; Nasopharyngeal(NP) swabs in vial transport medium  Result Value Ref Range Status   SARS Coronavirus 2 by RT PCR NEGATIVE NEGATIVE Final    Comment: (NOTE) SARS-CoV-2 target nucleic acids are NOT DETECTED.  The SARS-CoV-2 RNA is generally detectable in upper respiratory specimens during the acute phase of infection. The lowest concentration of SARS-CoV-2 viral copies this assay can detect is 138 copies/mL. A negative result does not preclude SARS-Cov-2 infection and should not be used as the sole basis for treatment or other patient management decisions. A negative result may occur with  improper specimen collection/handling, submission of specimen other than nasopharyngeal swab, presence of viral mutation(s) within the areas targeted by this assay, and inadequate number of viral copies(<138 copies/mL). A negative result must be combined with clinical observations, patient history, and epidemiological information. The expected result is Negative.  Fact Sheet for Patients:  BloggerCourse.com  Fact Sheet for Healthcare Providers:   SeriousBroker.it  This test is no t yet approved or cleared by the [place] FDA and  has been authorized for detection and/or diagnosis of SARS-CoV-2 by FDA under an Emergency Use Authorization (EUA). This EUA will remain  in effect (meaning this test can be used) for the duration of the COVID-19 declaration under Section 564(b)(1) of the Act, 21 U.S.C.section 360bbb-3(b)(1), unless the authorization is terminated  or revoked sooner.       Influenza A by PCR NEGATIVE NEGATIVE Final   Influenza B by PCR NEGATIVE NEGATIVE Final    Comment: (NOTE) The Xpert Xpress SARS-CoV-2/FLU/RSV plus assay is intended as an aid in the diagnosis of influenza from Nasopharyngeal swab specimens and should not be used as a sole basis for treatment. Nasal washings and aspirates are unacceptable for Xpert Xpress SARS-CoV-2/FLU/RSV testing.  Fact Sheet for Patients: BloggerCourse.com  Fact Sheet for Healthcare Providers: SeriousBroker.it  This test is not yet approved or cleared by the [place] FDA and has been authorized for detection and/or diagnosis of SARS-CoV-2 by FDA under an Emergency Use Authorization (EUA). This EUA will remain in effect (meaning this test can be used) for the duration of the COVID-19 declaration under Section 564(b)(1) of the Act, 21 U.S.C. section 360bbb-3(b)(1), unless the authorization is terminated or revoked.  Performed at [facility], [address] [place], [place] [address]      Signed: Lorin Glass  [facility] 09/27/2021, 10:39 AM

## 2021-09-27 NOTE — TOC Transition Note (Signed)
Transition of Care [facility]) - CM/SW Discharge Note   Patient Details  Name: Shane Dickerson MRN: [id number] Date of Birth: 11-29-1995  [facility]) CM/SW Contact:  Pollie Friar, RN Phone Number: 09/27/2021, 11:06 AM   Clinical Narrative:    Patient is discharging back to his group home today. CM met with the legal guardian at the bedside and she is aware and in agreement. CM called the group home director: Di Kindle and she is aware and will have him picked up around 1 pm. Medications sent to the pharmacy that group home selected. Bedside RN updated. No f/u per PT.    Final next level of care: Group Home Barriers to Discharge: No Barriers Identified   Patient Goals and CMS Choice        Discharge Placement                       Discharge Plan and Services                                     Social Determinants of Health (SDOH) Interventions     Readmission Risk Interventions No flowsheet data found.

## 2021-09-27 NOTE — Evaluation (Signed)
Occupational Therapy Evaluation Patient Details Name: Shane Dickerson MRN: [id number] DOB: 07/20/1996 Today's Date: 09/27/2021   History of Present Illness Pt is a 25 y/o male admitted 12/25 secondary to seizures. CT negative. PMH includes autism and bipolar disorder.   Clinical Impression   Pt admitted for concerns listed above. PTA, per chart, pt was fairly independent with ADL's, requiring cuing and supervision. At this time, pt follows simple commands well and is able to complete functional mobility and basic ADL's with min guard for safety. Pt expected to quickly return to baseline and will require no follow up OT. OT will follow acutely to address concerns listed below.       Recommendations for follow up therapy are one component of a multi-disciplinary discharge planning process, led by the attending physician.  Recommendations may be updated based on patient status, additional functional criteria and insurance authorization.   Follow Up Recommendations  No OT follow up    Assistance Recommended at Discharge Set up Supervision/Assistance  Functional Status Assessment  Patient has had a recent decline in their functional status and demonstrates the ability to make significant improvements in function in a reasonable and predictable amount of time.  Equipment Recommendations  None recommended by OT    Recommendations for Other Services       Precautions / Restrictions Precautions Precautions: Fall Restrictions Weight Bearing Restrictions: No      Mobility Bed Mobility Overal bed mobility: Modified Independent                  Transfers Overall transfer level: Needs assistance Equipment used: None Transfers: Sit to/from Stand Sit to Stand: Min guard           General transfer comment: Min guard for safety      Balance Overall balance assessment: Needs assistance Sitting-balance support: No upper extremity supported;Feet supported Sitting balance-Leahy  Scale: Good     Standing balance support: No upper extremity supported Standing balance-Leahy Scale: Fair                             ADL either performed or assessed with clinical judgement   ADL Overall ADL's : Needs assistance/impaired Eating/Feeding: Set up;Sitting   Grooming: Set up;Sitting   Upper Body Bathing: Set up;Sitting   Lower Body Bathing: Min guard;Sit to/from stand;Sitting/lateral leans   Upper Body Dressing : Supervision/safety;Sitting   Lower Body Dressing: Min guard;Sitting/lateral leans;Sit to/from stand   Toilet Transfer: Min guard;Ambulation   Toileting- Clothing Manipulation and Hygiene: Min guard;Sitting/lateral lean;Sit to/from stand       Functional mobility during ADLs: Min guard General ADL Comments: Pt overall min guard for safety     Vision Baseline Vision/History: 0 No visual deficits Ability to See in Adequate Light: 0 Adequate Patient Visual Report: No change from baseline Vision Assessment?: No apparent visual deficits Additional Comments: Functionally, pt's vision appears [facility].     Perception     Praxis      Pertinent Vitals/Pain Pain Assessment: Faces Faces Pain Scale: No hurt     Hand Dominance     Extremity/Trunk Assessment Upper Extremity Assessment Upper Extremity Assessment: Overall WFL for tasks assessed   Lower Extremity Assessment Lower Extremity Assessment: Defer to PT evaluation   Cervical / Trunk Assessment Cervical / Trunk Assessment: Normal   Communication Communication Communication: Expressive difficulties (nonverbal at baseline)   Cognition Arousal/Alertness: Awake/alert Behavior During Therapy: WFL for tasks assessed/performed Overall Cognitive Status: Difficult  to assess                                 General Comments: Able to follow simple commands     General Comments  No caregiver present    Exercises     Shoulder Instructions      Home Living  Family/patient expects to be discharged to:: Group home                                        Prior Functioning/Environment Prior Level of Function : Patient poor historian/Family not available             Mobility Comments: Unsure of baseline as pt unable to report          OT Problem List: Decreased strength;Decreased activity tolerance;Impaired balance (sitting and/or standing);Decreased coordination;Decreased cognition;Decreased safety awareness      OT Treatment/Interventions: Self-care/ADL training;Therapeutic exercise;Energy conservation;DME and/or AE instruction;Therapeutic activities;Cognitive remediation/compensation;Patient/family education;Balance training    OT Goals(Current goals can be found in the care plan section) Acute Rehab OT Goals Patient Stated Goal: None stated OT Goal Formulation: Patient unable to participate in goal setting Time For Goal Achievement: 10/11/21 Potential to Achieve Goals: Good ADL Goals Pt Will Perform Grooming: with modified independence;standing Pt Will Perform Lower Body Bathing: with modified independence;sitting/lateral leans;sit to/from stand Pt Will Perform Lower Body Dressing: with modified independence;sitting/lateral leans;sit to/from stand Pt Will Transfer to Toilet: with modified independence;ambulating Pt Will Perform Toileting - Clothing Manipulation and hygiene: with modified independence;sitting/lateral leans;sit to/from stand  OT Frequency: Min 2X/week   Barriers to D/C:            Co-evaluation              AM-PAC OT "6 Clicks" Daily Activity     Outcome Measure Help from another person eating meals?: A Little Help from another person taking care of personal grooming?: A Little Help from another person toileting, which includes using toliet, bedpan, or urinal?: A Little Help from another person bathing (including washing, rinsing, drying)?: A Little Help from another person to put on and  taking off regular upper body clothing?: A Little Help from another person to put on and taking off regular lower body clothing?: A Little 6 Click Score: 18   End of Session Nurse Communication: Mobility status  Activity Tolerance: Patient tolerated treatment well Patient left: in bed;with bed alarm set;with call bell/phone within reach  OT Visit Diagnosis: Unsteadiness on feet (R26.81);Other abnormalities of gait and mobility (R26.89);Muscle weakness (generalized) (M62.81);Other symptoms and signs involving the nervous system (R29.898)                Time: 4098-1191 OT Time Calculation (min): 11 min Charges:  OT General Charges $OT Visit: 1 Visit OT Evaluation $OT Eval Moderate Complexity: 1 Mod  Rhodesia Stanger H., OTR/L Acute Rehabilitation  Delsy Etzkorn Elane Darwin Guastella 09/27/2021, 2:14 PM

## 2021-11-01 ENCOUNTER — Encounter: Payer: Self-pay | Admitting: Neurology

## 2021-11-01 ENCOUNTER — Ambulatory Visit (INDEPENDENT_AMBULATORY_CARE_PROVIDER_SITE_OTHER): Payer: Medicare Other | Admitting: Neurology

## 2021-11-01 DIAGNOSIS — R569 Unspecified convulsions: Secondary | ICD-10-CM

## 2021-11-01 MED ORDER — DIVALPROEX SODIUM ER 250 MG PO TB24: ORAL_TABLET | ORAL | 3 refills | Status: DC

## 2021-11-01 MED ORDER — OXCARBAZEPINE 600 MG PO TABS: 600.0000 mg | ORAL_TABLET | Freq: Two times a day (BID) | ORAL | 4 refills | Status: DC

## 2021-11-01 MED ORDER — VALTOCO 5 MG DOSE 5 MG/0.1ML NA LIQD: 5.0000 mg | Freq: Once | NASAL | 2 refills | Status: DC

## 2021-11-01 NOTE — Progress Notes (Signed)
[facility]  PATIENT: Shane Dickerson DOB: [birth year]  REQUESTING CLINICIAN: Verlon AuBoyd, Tammy Lamonica, MD HISTORY FROM: Caregiver  REASON FOR VISIT: New onset seizure   HISTORICAL  CHIEF COMPLAINT:  Chief Complaint  Patient presents with   New Patient (Initial Visit)    Rm 12, with care giver  NP/Paper/[facility] Family-[facility] Marlinda MikeFarm/Tammy Boyd MD [phone]/new onset seizure Reports no new seizure activity, c/o behavior problems and hitting his head     HISTORY OF PRESENT ILLNESS:  This is a 26 year old gentleman with past medical history of autism spectrum disorder, intellectual disability who is presenting following a recent admission to the hospital for seizures.  Per caregiver patient had 2 tonic-clonic seizure on Christmas Day lasting up to 3-minutes.  He was taken to the ED and in the ED he was also noted to have a third generalized convulsion.  He was initially loaded on Keppra but due to his behavioral history, Keppra was discontinued and his Depakote and oxcarbazepine was increased.  Per caregiver there was a possibility that patient was not compliant with his Depakote, he had missed a few doses.  His level at that time was 11.  Since discharge from the hospital, he has not had any additional seizures but they have noticed increased lightheadedness and tremors.  There is also report of change in behavior, he has been banging his head against the wall a couple times a week.  He has not seen psychiatry since 2021, caregiver has set up an appointment with psychiatrist and they plan to see him to follow-up next month. On January 12, does not follow with his primary care doctor, had his valproic acid level taken and it was 138.  They report sleep is good, appetite is good, denies any recent falls.    Handedness: right handed   Onset: Christmas 2022  Seizure Type: Generalized convulstion  Current frequency: Once  Any injuries from seizures: No   Seizure risk factors:  ASD  Previous ASMs: Levetiracetam  Currenty ASMs: Valproic acid and oxcarbazepine  ASMs side effects: Tremors, dizziness   Brain Images: CT head, no acute intracranial abnormality.  Previous EEGs: EEG normal, no seizure or epileptiform discharge seen   OTHER MEDICAL CONDITIONS: Autism spectrum disorder, intellectual disability.  REVIEW OF SYSTEMS: Full 14 system review of systems performed and negative with exception of: Unable to fully obtain due to patient intellectual disability.  ALLERGIES: No Known Allergies  HOME MEDICATIONS: Outpatient Medications Prior to Visit  Medication Sig Dispense Refill   benztropine (COGENTIN) 1 MG tablet Take 1 mg by mouth 2 (two) times daily.     cetirizine (ZYRTEC) 10 MG tablet Take 10 mg by mouth at bedtime.      FLUoxetine (PROZAC) 10 MG capsule Take 10 mg by mouth daily.     fluticasone (VERAMYST) 27.5 MCG/SPRAY nasal spray Place 1 spray into the nose daily.      haloperidol (HALDOL) 5 MG tablet Take 5 mg by mouth 3 (three) times daily.     selenium sulfide (SELSUN) 2.5 % shampoo Apply 1 application topically every Saturday.     temazepam (RESTORIL) 15 MG capsule Take 15 mg by mouth at bedtime.     traZODone (DESYREL) 150 MG tablet Take 150 mg by mouth at bedtime.     divalproex (DEPAKOTE ER) 250 MG 24 hr tablet Take 3 tablets (750 mg total) by mouth 3 (three) times daily. 270 tablet 0   Oxcarbazepine (TRILEPTAL) 300 MG tablet Take 2 tablets (600 mg total) by  mouth 2 (two) times daily. 120 tablet 0   No facility-administered medications prior to visit.    PAST MEDICAL HISTORY: Past Medical History:  Diagnosis Date   Allergy    seasonal allergies   Anxiety    Autism spectrum    Bipolar 1 disorder (HCC)    Insomnia     PAST SURGICAL HISTORY: History reviewed. No pertinent surgical history.  FAMILY HISTORY: Family History  Family history unknown: Yes    SOCIAL HISTORY: Social History   Socioeconomic History   Marital  status: Single    Spouse name: Not on file   Number of children: Not on file   Years of education: Not on file   Highest education level: Not on file  Occupational History   Not on file  Tobacco Use   Smoking status: Never   Smokeless tobacco: Never  Vaping Use   Vaping Use: Never used  Substance and Sexual Activity   Alcohol use: Never   Drug use: Never   Sexual activity: Not Currently  Other Topics Concern   Not on file  Social History Narrative   Not on file   Social Determinants of Health   Financial Resource Strain: Not on file  Food Insecurity: Not on file  Transportation Needs: Not on file  Physical Activity: Not on file  Stress: Not on file  Social Connections: Not on file  Intimate Partner Violence: Not on file    PHYSICAL EXAM  GENERAL EXAM/CONSTITUTIONAL: Vitals:  Vitals:   There is no height or weight on file to calculate BMI. Wt Readings from Last 3 Encounters:  09/25/21 189 lb 9.5 oz (86 kg)  02/03/20 250 lb (113.4 kg)   Patient is in no distress; well developed, nourished and groomed; neck is supple  CARDIOVASCULAR: Examination of carotid arteries is normal; no carotid bruits Regular rate and rhythm, no murmurs Examination of peripheral vascular system by observation and palpation is normal  EYES: Pupils round and reactive to light, Visual fields full to confrontation, Extraocular movements intacts,  No results found.  MUSCULOSKELETAL: Gait, strength, tone, movements noted in Neurologic exam below  NEUROLOGIC: MENTAL STATUS:  No flowsheet data found. Non verbal, can gesture and mimic   CRANIAL NERVE:  2nd, 3rd, 4th, 6th - pupils equal and reactive to light, visual fields full to confrontation, extraocular muscles intact, no nystagmus 5th - facial sensation symmetric 7th - facial strength symmetric 9th - palate elevates symmetrically, uvula midline 11th - shoulder shrug symmetric  MOTOR:  normal bulk and tone, full strength in the  BUE, BLE   GAIT/STATION:  normal     DIAGNOSTIC DATA (LABS, IMAGING, TESTING) - I reviewed patient records, labs, notes, testing and imaging myself where available.  Lab Results  Component Value Date   WBC 7.7 09/27/2021   HGB 10.6 (L) 09/27/2021   HCT 31.2 (L) 09/27/2021   MCV 98.7 09/27/2021   PLT 192 09/27/2021      Component Value Date/Time   NA 138 09/27/2021 0246   K 3.7 09/27/2021 0246   CL 103 09/27/2021 0246   CO2 25 09/27/2021 0246   GLUCOSE 84 09/27/2021 0246   BUN 15 09/27/2021 0246   CREATININE 0.76 09/27/2021 0246   CALCIUM 8.7 (L) 09/27/2021 0246   PROT 6.3 (L) 09/25/2021 0408   ALBUMIN 3.3 (L) 09/25/2021 0408   AST 23 09/25/2021 0408   ALT 21 09/25/2021 0408   ALKPHOS 44 09/25/2021 0408   BILITOT 0.4 09/25/2021 0408   GFRNONAA >  60 09/27/2021 0246   GFRAA >60 06/12/2020 1614   No results found for: CHOL, HDL, LDLCALC, LDLDIRECT, TRIG No results found for: HGBA1C Lab Results  Component Value Date   VITAMINB12 608 09/25/2021   No results found for: TSH   Depakote level December 26:  11 Depakote level January 12:    138   Head CT 12/25 No acute intracranial pathology  EEG 12/26 This technically difficult study is within normal limits. No seizures or epileptiform discharges were seen throughout the recording. If suspicion for ictal- interictal activity remains a concern, a prolonged study can be considered.    I personally reviewed brain Images and previous EEG reports.     ASSESSMENT AND PLAN  26 y.o. year old male  with autism spectrum disorder and intellectual disability who is presenting following a first lifetime seizures.  He was previously on Depakote 500 mg 3 times daily and oxcarbazepine 300 mg twice daily.  There was report during that time he had missed some of his antiseizure medications.  On admission his Depakote level was 11.  On discharge his Depakote dose has been increased to 750 3 times daily and his ox carb was also  increased to 600 mg twice daily.  There is report of tremor, lightheadedness and I do think it is a side effect of the Depakote since his last level was 138.  Since his Trileptal was double, I feel that is reasonable to decrease the Depakote to 500/500/750 to reduce side effect of tremor and lightheadedness.  I will obtain a Depakote level and CMP in 2 weeks and will adjust as needed.  Recommended to follow-up with psychiatry as scheduled and I will see him in 656-months for follow-up. I have also provided a rescue medication Valtoco to use for seizure lasting more than 5 min.    1. Seizure [facility](HCC)     Patient Instructions  Decrease Depakote to 500/500/750 due to side effects of tremors and lightheadedness, last Depakote level was 138 Check a Depakote and CMP in 2 weeks  Continue with Trileptal 600 mg BID  Continue your other medications  Provided rescue mediation Valtoco Follow up in 6 months or sooner if worse    [place], patients with seizures are not allowed to drive until they have been seizure-free for six months.  Other recommendations include using caution when using heavy equipment or power tools. Avoid working on ladders or at heights. Take showers instead of baths.  Do not swim alone.  Ensure the water temperature is not too high on the home water heater. Do not go swimming alone. Do not lock yourself in a room alone (i.e. bathroom). When caring for infants or small children, sit down when holding, feeding, or changing them to minimize risk of injury to the child in the event you have a seizure. Maintain good sleep hygiene. Avoid alcohol.  Also recommend adequate sleep, hydration, good diet and minimize stress.   During the Seizure  - First, ensure adequate ventilation and place patients on the floor on their left side  Loosen clothing around the neck and ensure the airway is patent. If the patient is clenching the teeth, do not force the mouth open with any object  as this can cause severe damage - Remove all items from the surrounding that can be hazardous. The patient may be oblivious to what's happening and may not even know what he or she is doing. If the patient is confused and wandering,  either gently guide him/her away and block access to outside areas - Reassure the individual and be comforting - Call 911. In most cases, the seizure ends before EMS arrives. However, there are cases when seizures may last over 3 to 5 minutes. Or the individual may have developed breathing difficulties or severe injuries. If a pregnant patient or a person with diabetes develops a seizure, it is prudent to call an ambulance. - Finally, if the patient does not regain full consciousness, then call EMS. Most patients will remain confused for about 45 to 90 minutes after a seizure, so you must use judgment in calling for help. - Avoid restraints but make sure the patient is in a bed with padded side rails - Place the individual in a lateral position with the neck slightly flexed; this will help the saliva drain from the mouth and prevent the tongue from falling backward - Remove all nearby furniture and other hazards from the area - Provide verbal assurance as the individual is regaining consciousness - Provide the patient with privacy if possible - Call for help and start treatment as ordered by the caregiver   After the Seizure (Postictal Stage)  After a seizure, most patients experience confusion, fatigue, muscle pain and/or a headache. Thus, one should permit the individual to sleep. For the next few days, reassurance is essential. Being calm and helping reorient the person is also of importance.  Most seizures are painless and end spontaneously. Seizures are not harmful to others but can lead to complications such as stress on the lungs, brain and the heart. Individuals with prior lung problems may develop labored breathing and respiratory distress.     Orders Placed  This Encounter  Procedures   Valproic Acid Level   CMP    Meds ordered this encounter  Medications   divalproex (DEPAKOTE ER) 250 MG 24 hr tablet    Sig: 500 (2 tabs) mg in AM/ 500 (2 tabs) mg at noon/ 750 (3 tabs) mg PM    Dispense:  270 tablet    Refill:  3   Oxcarbazepine (TRILEPTAL) 600 MG tablet    Sig: Take 1 tablet (600 mg total) by mouth 2 (two) times daily.    Dispense:  180 tablet    Refill:  4   diazePAM (VALTOCO 5 MG DOSE) 5 MG/0.1ML LIQD    Sig: Place 5 mg into the nose once for 1 dose.    Dispense:  1 each    Refill:  2    Return in about 6 months (around 05/01/2022).    Windell Norfolk, MD 11/01/2021, 1:39 PM  [facility] [address], [address] [place], [place] [address] [phone]

## 2021-11-01 NOTE — Patient Instructions (Addendum)
Decrease Depakote to 500/500/750 due to side effects of tremors and lightheadedness, last Depakote level was 138 Check a Depakote and CMP in 2 weeks  Continue with Trileptal 600 mg BID  Continue your other medications  Provided rescue mediation Valtoco Follow up in 6 months or sooner if worse

## 2022-05-03 ENCOUNTER — Ambulatory Visit (INDEPENDENT_AMBULATORY_CARE_PROVIDER_SITE_OTHER): Payer: Medicare Other | Admitting: Neurology

## 2022-05-03 ENCOUNTER — Encounter: Payer: Self-pay | Admitting: Neurology

## 2022-05-03 VITALS — BP 89/60 | HR 79 | Ht 71.0 in | Wt 185.0 lb

## 2022-05-03 DIAGNOSIS — G40909 Epilepsy, unspecified, not intractable, without status epilepticus: Secondary | ICD-10-CM | POA: Diagnosis not present

## 2022-05-03 DIAGNOSIS — F79 Unspecified intellectual disabilities: Secondary | ICD-10-CM

## 2022-05-03 NOTE — Progress Notes (Signed)
[facility]  PATIENT: Shane Dickerson DOB: 30-Sep-1996  REQUESTING CLINICIAN: Verlon Au, MD HISTORY FROM: Caregiver  REASON FOR VISIT: New onset seizure   HISTORICAL  CHIEF COMPLAINT:  Chief Complaint  Patient presents with   Follow-up    Rm 12. Accompanied by Farrel Conners. C/o issues with aggression. Last seizure was in December.   INTERVAL HISTORY 05/03/2022:  Shane Dickerson presents today for follow-up, he is accompanied by caregiver Jyreece.  No seizure since last visit but still has issues with behavior.  He is compliant with his Depakote 500/500/750 and his Trileptal 600/600.  They have not follow-up with psychiatry.  Caregiver report worsening behavior, hitting his head on the wall.  Again no other seizures.  HISTORY OF PRESENT ILLNESS:  This is a 26 year old gentleman with past medical history of autism spectrum disorder, intellectual disability who is presenting following a recent admission to the hospital for seizures.  Per caregiver patient had 2 tonic-clonic seizure on Christmas Day lasting up to 3-minutes.  He was taken to the ED and in the ED he was also noted to have a third generalized convulsion.  He was initially loaded on Keppra but due to his behavioral history, Keppra was discontinued and his Depakote and oxcarbazepine was increased.  Per caregiver there was a possibility that patient was not compliant with his Depakote, he had missed a few doses.  His level at that time was 11.  Since discharge from the hospital, he has not had any additional seizures but they have noticed increased lightheadedness and tremors.  There is also report of change in behavior, he has been banging his head against the wall a couple times a week.  He has not seen psychiatry since 2021, caregiver has set up an appointment with psychiatrist and they plan to see him to follow-up next month. On January 12, does not follow with his primary care doctor, had his valproic acid level  taken and it was 138.  They report sleep is good, appetite is good, denies any recent falls.    Handedness: right handed   Onset: Christmas 2022  Seizure Type: Generalized convulstion  Current frequency: Once  Any injuries from seizures: No   Seizure risk factors: ASD  Previous ASMs: Levetiracetam  Currenty ASMs: Valproic acid and oxcarbazepine  ASMs side effects: Tremors, dizziness   Brain Images: CT head, no acute intracranial abnormality.  Previous EEGs: EEG normal, no seizure or epileptiform discharge seen   OTHER MEDICAL CONDITIONS: Autism spectrum disorder, intellectual disability.  REVIEW OF SYSTEMS: Full 14 system review of systems performed and negative with exception of: Unable to fully obtain due to patient intellectual disability.  ALLERGIES: No Known Allergies  HOME MEDICATIONS: Outpatient Medications Prior to Visit  Medication Sig Dispense Refill   benztropine (COGENTIN) 1 MG tablet Take 1 mg by mouth 2 (two) times daily.     cetirizine (ZYRTEC) 10 MG tablet Take 10 mg by mouth at bedtime.      divalproex (DEPAKOTE ER) 250 MG 24 hr tablet 500 (2 tabs) mg in AM/ 500 (2 tabs) mg at noon/ 750 (3 tabs) mg PM 270 tablet 3   FLUoxetine (PROZAC) 10 MG capsule Take 10 mg by mouth daily.     fluticasone (VERAMYST) 27.5 MCG/SPRAY nasal spray Place 1 spray into the nose daily.      haloperidol (HALDOL) 5 MG tablet Take 5 mg by mouth 3 (three) times daily.     selenium sulfide (SELSUN) 2.5 % shampoo Apply 1 application  topically every Saturday.     temazepam (RESTORIL) 15 MG capsule Take 15 mg by mouth at bedtime.     traZODone (DESYREL) 150 MG tablet Take 150 mg by mouth at bedtime.     diazePAM (VALTOCO 5 MG DOSE) 5 MG/0.1ML LIQD Place 5 mg into the nose once for 1 dose. 1 each 2   Oxcarbazepine (TRILEPTAL) 600 MG tablet Take 1 tablet (600 mg total) by mouth 2 (two) times daily. 180 tablet 4   No facility-administered medications prior to visit.    PAST MEDICAL  HISTORY: Past Medical History:  Diagnosis Date   Allergy    seasonal allergies   Anxiety    Autism spectrum    Bipolar 1 disorder (HCC)    Insomnia     PAST SURGICAL HISTORY: History reviewed. No pertinent surgical history.  FAMILY HISTORY: Family History  Family history unknown: Yes    SOCIAL HISTORY: Social History   Socioeconomic History   Marital status: Single    Spouse name: Not on file   Number of children: Not on file   Years of education: Not on file   Highest education level: Not on file  Occupational History   Not on file  Tobacco Use   Smoking status: Never   Smokeless tobacco: Never  Vaping Use   Vaping Use: Never used  Substance and Sexual Activity   Alcohol use: Never   Drug use: Never   Sexual activity: Not Currently  Other Topics Concern   Not on file  Social History Narrative   Not on file   Social Determinants of Health   Financial Resource Strain: Not on file  Food Insecurity: Not on file  Transportation Needs: Not on file  Physical Activity: Not on file  Stress: Not on file  Social Connections: Not on file  Intimate Partner Violence: Not on file    PHYSICAL EXAM  GENERAL EXAM/CONSTITUTIONAL: Vitals:  Vitals:   05/03/22 1110  BP: (!) 89/60  Pulse: 79  Weight: 185 lb (83.9 kg)  Height: 5\' 11"  (1.803 m)    Body mass index is 25.8 kg/m. Wt Readings from Last 3 Encounters:  05/03/22 185 lb (83.9 kg)  09/25/21 189 lb 9.5 oz (86 kg)  02/03/20 250 lb (113.4 kg)   Patient is in no distress; well developed, nourished and groomed; neck is supple.   EYES: Pupils round and reactive to light, Visual fields full to confrontation, Extraocular movements intacts,  No results found.  MUSCULOSKELETAL: Gait, strength, tone, movements noted in Neurologic exam below  NEUROLOGIC: MENTAL STATUS:      No data to display         Non verbal, can gesture and mimic   CRANIAL NERVE:  2nd, 3rd, 4th, 6th - pupils equal and reactive to  light, visual fields full to confrontation, extraocular muscles intact, no nystagmus 5th - facial sensation symmetric 7th - facial strength symmetric 9th - palate elevates symmetrically, uvula midline 11th - shoulder shrug symmetric  MOTOR:  normal bulk and tone, full strength in the BUE, BLE   GAIT/STATION:  normal   DIAGNOSTIC DATA (LABS, IMAGING, TESTING) - I reviewed patient records, labs, notes, testing and imaging myself where available.  Lab Results  Component Value Date   WBC 7.7 09/27/2021   HGB 10.6 (L) 09/27/2021   HCT 31.2 (L) 09/27/2021   MCV 98.7 09/27/2021   PLT 192 09/27/2021      Component Value Date/Time   NA 138 09/27/2021 0246  K 3.7 09/27/2021 0246   CL 103 09/27/2021 0246   CO2 25 09/27/2021 0246   GLUCOSE 84 09/27/2021 0246   BUN 15 09/27/2021 0246   CREATININE 0.76 09/27/2021 0246   CALCIUM 8.7 (L) 09/27/2021 0246   PROT 6.3 (L) 09/25/2021 0408   ALBUMIN 3.3 (L) 09/25/2021 0408   AST 23 09/25/2021 0408   ALT 21 09/25/2021 0408   ALKPHOS 44 09/25/2021 0408   BILITOT 0.4 09/25/2021 0408   GFRNONAA >60 09/27/2021 0246   GFRAA >60 06/12/2020 1614   No results found for: "CHOL", "HDL", "LDLCALC", "LDLDIRECT", "TRIG" No results found for: "HGBA1C" Lab Results  Component Value Date   VITAMINB12 608 09/25/2021   No results found for: "TSH"   Depakote level December 26:  11 Depakote level January 12:    138   Head CT 12/25 No acute intracranial pathology  EEG 12/26 This technically difficult study is within normal limits. No seizures or epileptiform discharges were seen throughout the recording. If suspicion for ictal- interictal activity remains a concern, a prolonged study can be considered.    I personally reviewed brain Images and previous EEG reports.    ASSESSMENT AND PLAN  26 y.o. year old male  with autism spectrum disorder and intellectual disability who is presenting  for seizure follow up.  No seizures since last visit,  he is compliant with his Depakote 500 500 750 and his Trileptal 600 600.  Reported that tremors and the dizziness resolved after decreasing the Depakote.  He still having issues with his behavior, hitting his head on the wall and they have not follow-up with psychiatry yet.  Recommend him to follow-up with PCP for referral to psychiatry.  Continue current medications, return in 1 year or sooner if worse.    1. Seizure disorder (HCC)   2. Intellectual disability      Patient Instructions  Continue with Depakote 500/500/750 Continue with Trileptal 600/600 Follow-up with PCP for referral to psychiatry Return in 1 year or sooner if worse.    Per [facility] statutes, patients with seizures are not allowed to drive until they have been seizure-free for six months.  Other recommendations include using caution when using heavy equipment or power tools. Avoid working on ladders or at heights. Take showers instead of baths.  Do not swim alone.  Ensure the water temperature is not too high on the home water heater. Do not go swimming alone. Do not lock yourself in a room alone (i.e. bathroom). When caring for infants or small children, sit down when holding, feeding, or changing them to minimize risk of injury to the child in the event you have a seizure. Maintain good sleep hygiene. Avoid alcohol.  Also recommend adequate sleep, hydration, good diet and minimize stress.   During the Seizure  - First, ensure adequate ventilation and place patients on the floor on their left side  Loosen clothing around the neck and ensure the airway is patent. If the patient is clenching the teeth, do not force the mouth open with any object as this can cause severe damage - Remove all items from the surrounding that can be hazardous. The patient may be oblivious to what's happening and may not even know what he or she is doing. If the patient is confused and wandering, either gently guide him/her away and  block access to outside areas - Reassure the individual and be comforting - Call 911. In most cases, the seizure ends before EMS arrives.  However, there are cases when seizures may last over 3 to 5 minutes. Or the individual may have developed breathing difficulties or severe injuries. If a pregnant patient or a person with diabetes develops a seizure, it is prudent to call an ambulance. - Finally, if the patient does not regain full consciousness, then call EMS. Most patients will remain confused for about 45 to 90 minutes after a seizure, so you must use judgment in calling for help. - Avoid restraints but make sure the patient is in a bed with padded side rails - Place the individual in a lateral position with the neck slightly flexed; this will help the saliva drain from the mouth and prevent the tongue from falling backward - Remove all nearby furniture and other hazards from the area - Provide verbal assurance as the individual is regaining consciousness - Provide the patient with privacy if possible - Call for help and start treatment as ordered by the caregiver   After the Seizure (Postictal Stage)  After a seizure, most patients experience confusion, fatigue, muscle pain and/or a headache. Thus, one should permit the individual to sleep. For the next few days, reassurance is essential. Being calm and helping reorient the person is also of importance.  Most seizures are painless and end spontaneously. Seizures are not harmful to others but can lead to complications such as stress on the lungs, brain and the heart. Individuals with prior lung problems may develop labored breathing and respiratory distress.     No orders of the defined types were placed in this encounter.   No orders of the defined types were placed in this encounter.   Return in about 1 year (around 05/04/2023).  I have spent a total of 30 minutes dedicated to this patient today, preparing to see patient, performing a  medically appropriate examination and evaluation, ordering tests and/or medications and procedures, and counseling and educating the patient/family/caregiver; independently interpreting result and communicating results to the family/patient/caregiver; and documenting clinical information in the electronic medical record.   Windell Norfolk, MD 05/03/2022, 11:47 AM  [facility] [address], [address] [place], [place] [address] [phone]

## 2022-05-03 NOTE — Patient Instructions (Signed)
Continue with Depakote 500/500/750 Continue with Trileptal 600/600 Follow-up with PCP for referral to psychiatry Return in 1 year or sooner if worse.

## 2022-07-12 ENCOUNTER — Telehealth: Payer: Self-pay

## 2022-07-12 NOTE — Telephone Encounter (Signed)
We received a signed blank release of records for patient. LVM for patient to see if he would like his records from our office sent somewhere, or if he would like us to request his records from another office. 

## 2022-08-02 IMAGING — CT CT HEAD W/O CM
3 of 6 series · 16 of 47 positions shown, 19 images · non-contrast
Comparison: February 03, 2020

CLINICAL DATA: New onset seizure.

EXAM:
CT HEAD WITHOUT CONTRAST
TECHNIQUE: Contiguous axial images were obtained from the base of the skull
through the vertex without intravenous contrast.

[Series 3: head 5.0 h30s · axial · 0.43mm/px · z∈[+1572,+1712]mm · 11 of 34 slices shown, 14 images]
[im 3/34  brain]
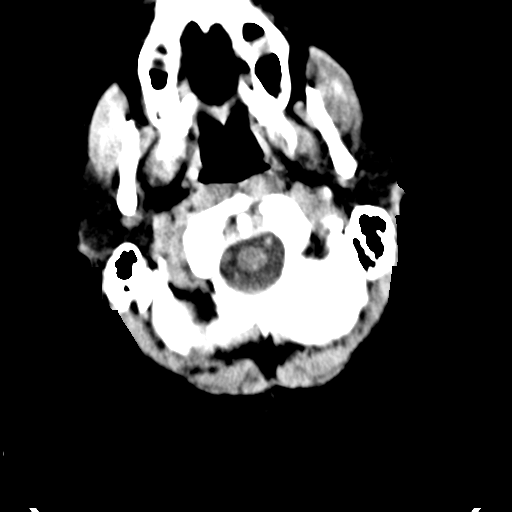
[im 3/34  bone]
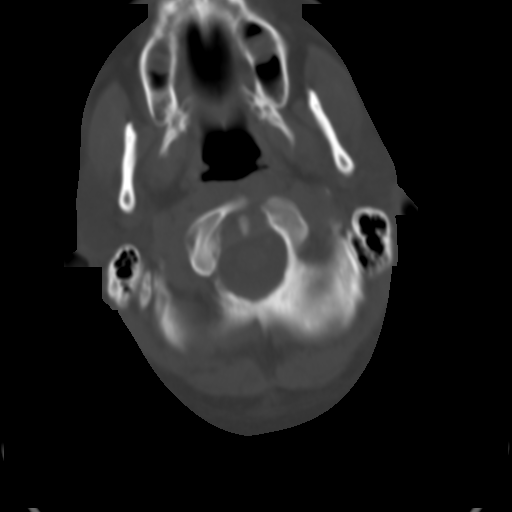
[im 5/34  brain]
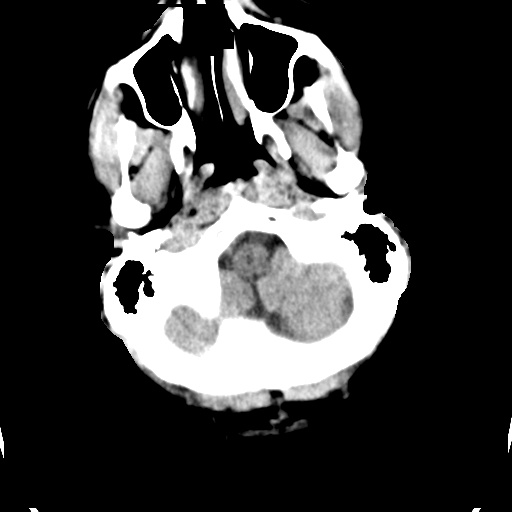
[im 8/34  brain]
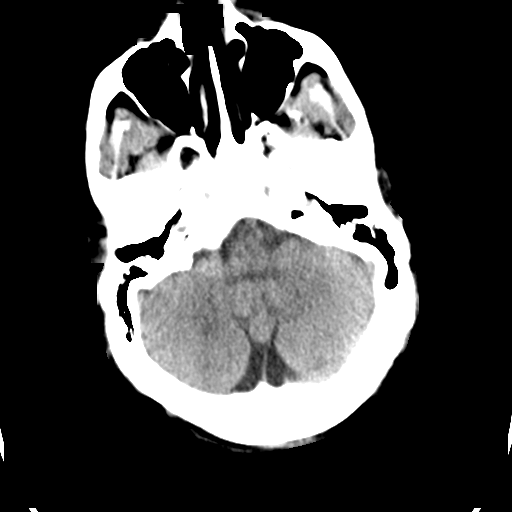
[im 12/34  brain]
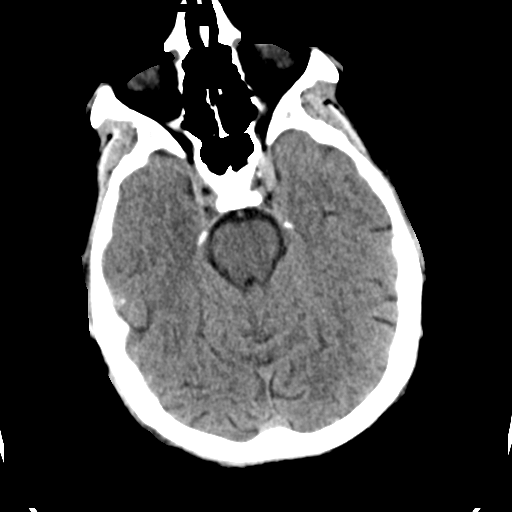
[im 15/34  brain]
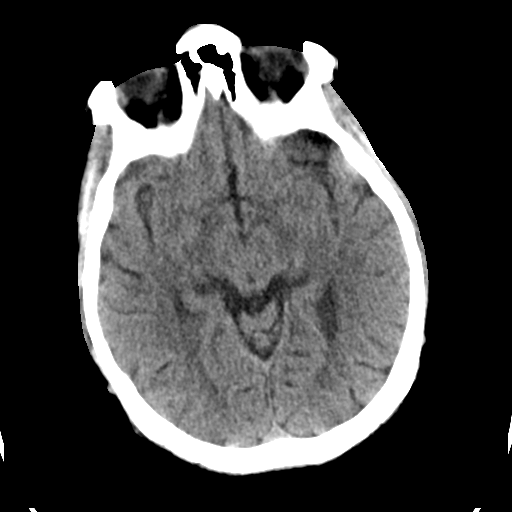
[im 15/34  bone]
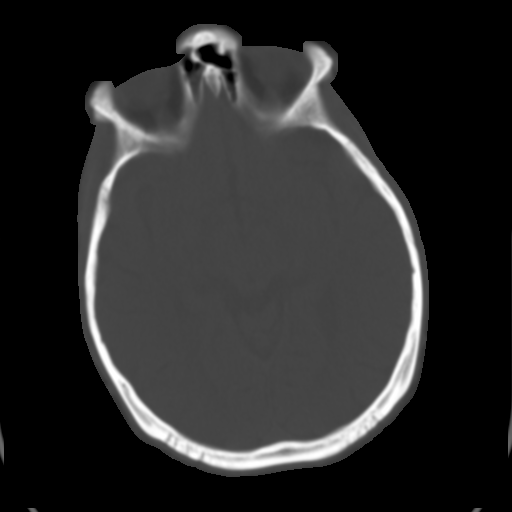
[im 17/34  brain]
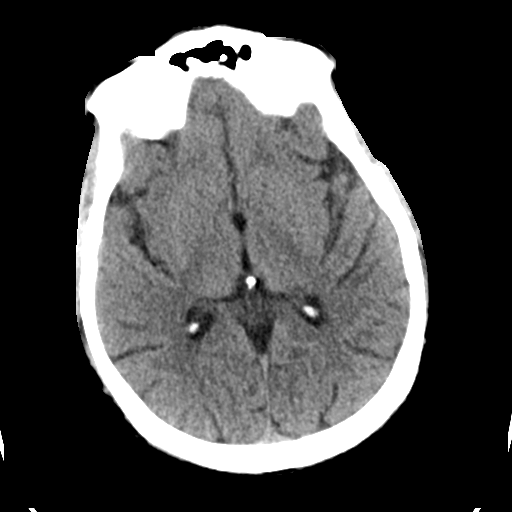
[im 19/34  brain]
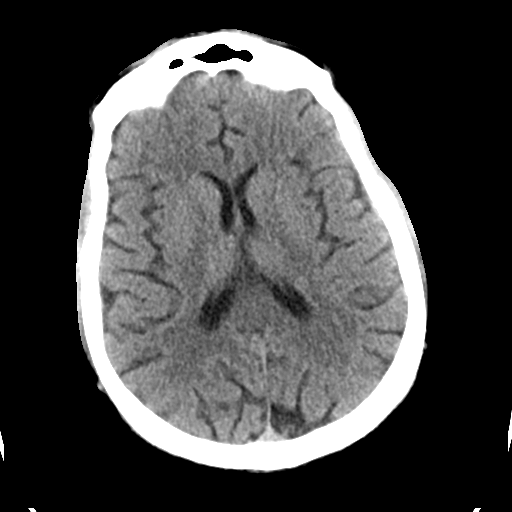
[im 22/34  brain]
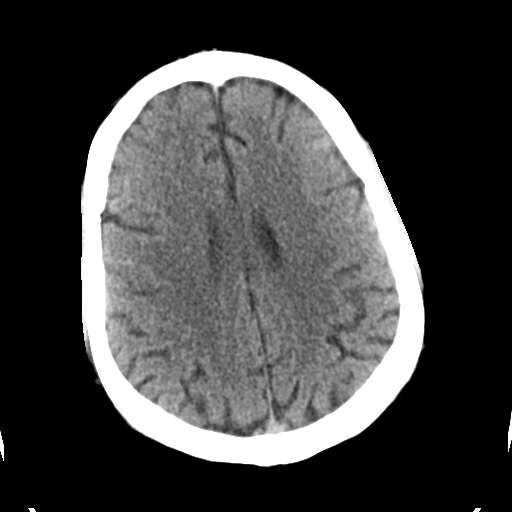
[im 26/34  brain]
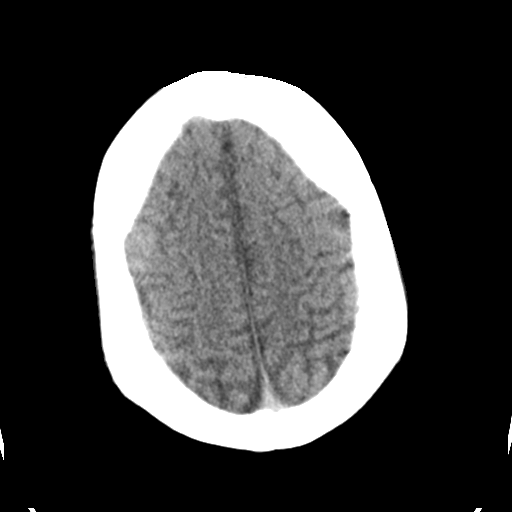
[im 26/34  bone]
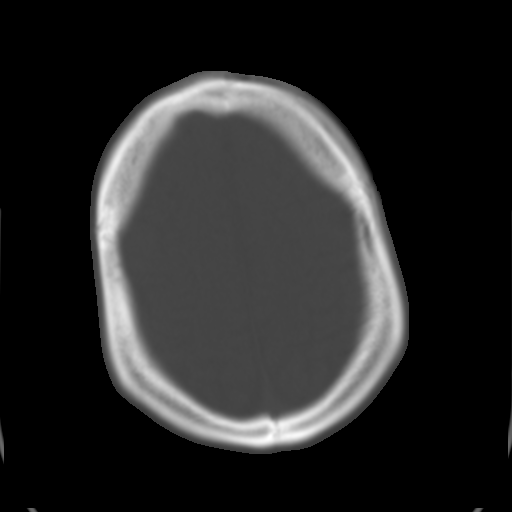
[im 29/34  brain]
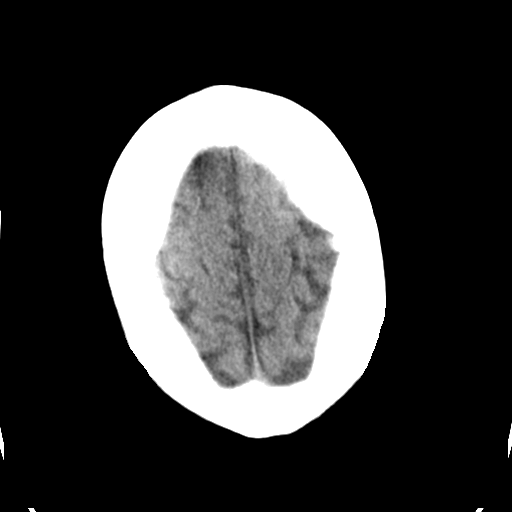
[im 31/34  brain]
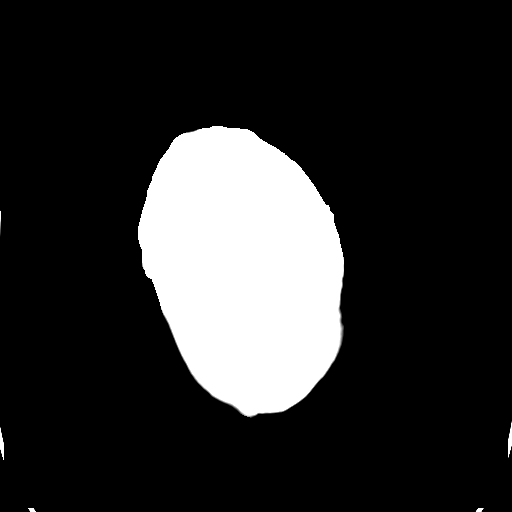

[Series 5: head 3.0 mpr cor · coronal · 0.33mm/px · 3 of 73 slices shown]
[im 19/73  brain]
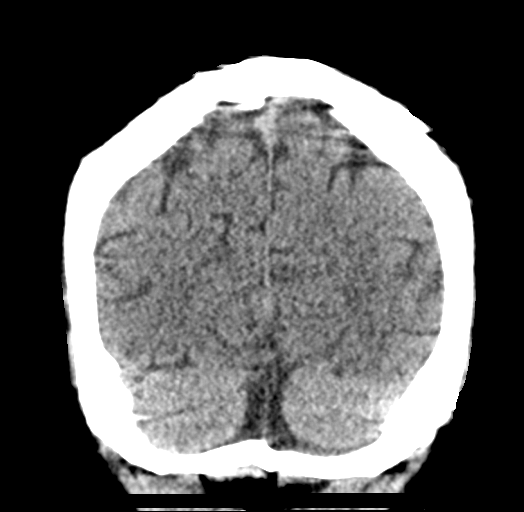
[im 37/73  brain]
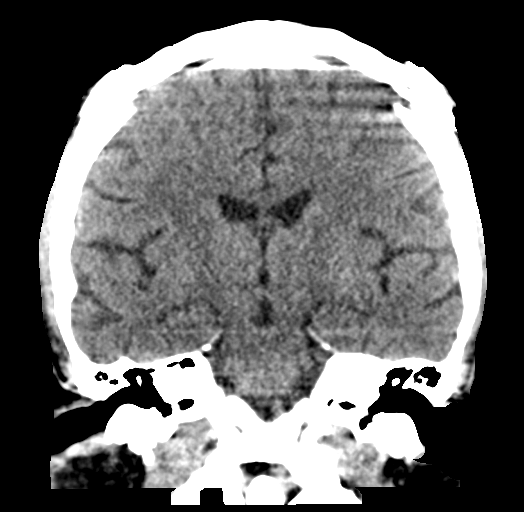
[im 55/73  brain]
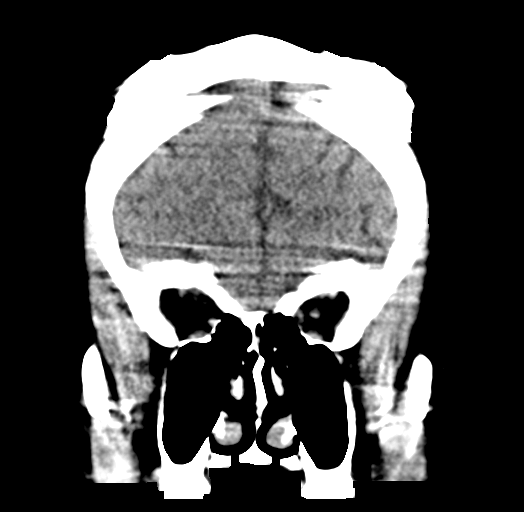

[Series 6: head 3.0 mpr sag · sagittal · 0.33mm/px · 2 of 61 slices shown]
[im 21/61  brain]
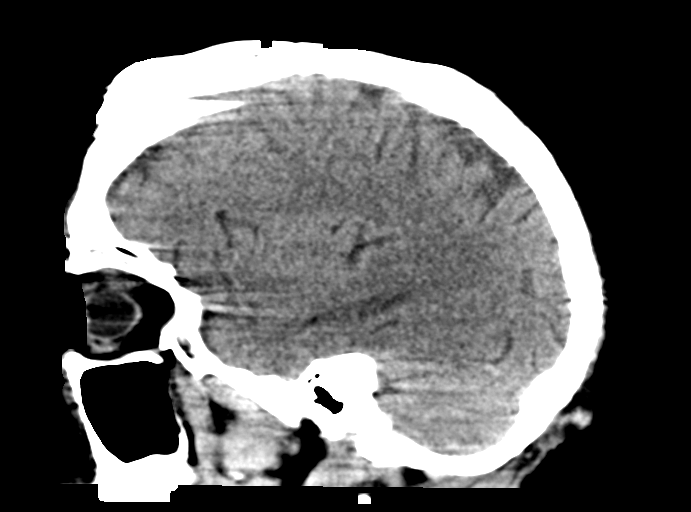
[im 41/61  brain]
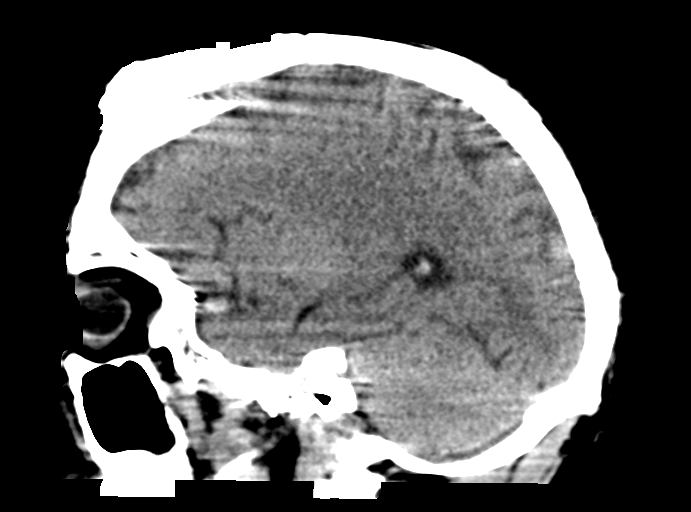

[16 of 47 positions shown; findings below may reference images not displayed]

FINDINGS: Brain: No evidence of acute infarction, hemorrhage, hydrocephalus,
extra-axial collection or mass lesion/mass effect.

Vascular: No hyperdense vessel or unexpected calcification.

Skull: Normal. Negative for fracture or focal lesion.

Sinuses/Orbits: No acute finding.

Other: None.
IMPRESSION: No acute intracranial pathology.

## 2022-10-09 ENCOUNTER — Other Ambulatory Visit: Payer: Self-pay

## 2022-10-09 ENCOUNTER — Other Ambulatory Visit: Payer: Self-pay | Admitting: Neurology

## 2022-10-09 MED ORDER — VALTOCO 5 MG DOSE 5 MG/0.1ML NA LIQD: 5.0000 mg | Freq: Once | NASAL | 2 refills | Status: AC

## 2022-10-09 NOTE — Progress Notes (Signed)
Done. Thanks.

## 2023-01-23 ENCOUNTER — Telehealth: Payer: Self-pay

## 2023-01-23 NOTE — Telephone Encounter (Signed)
Call to Rockne Coons,  care manager at group home that patient lives at. She states that they use [facility] for medication refills and uses [facility] as a back up pharmacy. Advised we received a refill request for Valtoco and would have prescription sent. Ms. Jean Rosenthal appreciative of call.  Call back to Ms. Jean Rosenthal to confirm mailing address for medication is what is listed in patient chart. Address confirmed. Rx sent to Dr. Teresa Coombs for signature.

## 2023-01-24 ENCOUNTER — Telehealth: Payer: Self-pay

## 2023-01-24 NOTE — Telephone Encounter (Signed)
Dr. Teresa Coombs signed paper script for [facility] and prescription was faxed to [facility] [phone]

## 2023-02-06 ENCOUNTER — Encounter: Payer: Self-pay | Admitting: Neurology

## 2023-02-06 ENCOUNTER — Ambulatory Visit (INDEPENDENT_AMBULATORY_CARE_PROVIDER_SITE_OTHER): Payer: Medicare Other | Admitting: Neurology

## 2023-02-06 VITALS — BP 132/78 | Ht 71.0 in | Wt 216.0 lb

## 2023-02-06 DIAGNOSIS — G40909 Epilepsy, unspecified, not intractable, without status epilepticus: Secondary | ICD-10-CM

## 2023-02-06 DIAGNOSIS — Z5181 Encounter for therapeutic drug level monitoring: Secondary | ICD-10-CM

## 2023-02-06 NOTE — Patient Instructions (Addendum)
Continue Depakote 500 mg 3 times daily Continue with Trileptal 450 mg twice daily Will check ASMs level including CMP Continue follow-up with PCP Return in 6 months or sooner if worse

## 2023-02-06 NOTE — Progress Notes (Signed)
[facility]  PATIENT: Shane Dickerson DOB: [birth year]  REQUESTING CLINICIAN: Verlon Au, MD HISTORY FROM: Caregiver  REASON FOR VISIT: Seizure   HISTORICAL  CHIEF COMPLAINT:  Chief Complaint  Patient presents with   Follow-up    Rm 13, with caregiver [name], PCP requested follow due to low sodium, valproic acid level elevated and request BMP. Last seizure 2022.   INTERVAL HISTORY 02/06/2023:  Patient presents today for follow-up, he is accompanied by caregiver [name].  At last visit in August, plan was to continue with Depakote 500/500/750 and Trileptal 600/600.  At the facility, his sodium was low and Depakote was decreased to 500 3 times daily and Trileptal was decreased to 450 twice daily.  He has not had any additional seizures.  His last sodium level in January 2024 was 132.  Caregiver informed us that he is PCP had done additional sodium check and it was still low and he was referred here.  Again he does not have experience any signs of hyponatremia.  Review of his lab work has show previous history of low sodium to 132. He is still banging his head, he does have a helmet, scheduled to see psychiatry this week again     INTERVAL HISTORY 05/03/2022:  Shane Dickerson presents today for follow-up, he is accompanied by caregiver Jyreece.  No seizure since last visit but still has issues with behavior.  He is compliant with his Depakote 500/500/750 and his Trileptal 600/600.  They have not follow-up with psychiatry.  Caregiver report worsening behavior, hitting his head on the wall.  Again no other seizures.  HISTORY OF PRESENT ILLNESS:  This is a 27 year old gentleman with past medical history of autism spectrum disorder, intellectual disability who is presenting following a recent admission to the hospital for seizures.  Per caregiver patient had 2 tonic-clonic seizure on Christmas Day lasting up to 3-minutes.  He was taken to the ED and in the ED he was also noted to  have a third generalized convulsion.  He was initially loaded on Keppra but due to his behavioral history, Keppra was discontinued and his Depakote and oxcarbazepine was increased.  Per caregiver there was a possibility that patient was not compliant with his Depakote, he had missed a few doses.  His level at that time was 11.  Since discharge from the hospital, he has not had any additional seizures but they have noticed increased lightheadedness and tremors.  There is also report of change in behavior, he has been banging his head against the wall a couple times a week.  He has not seen psychiatry since 2021, caregiver has set up an appointment with psychiatrist and they plan to see him to follow-up next month. On January 12, does not follow with his primary care doctor, had his valproic acid level taken and it was 138.  They report sleep is good, appetite is good, denies any recent falls.    Handedness: right handed   Onset: Christmas 2022  Seizure Type: Generalized convulstion  Current frequency: Once  Any injuries from seizures: No   Seizure risk factors: ASD  Previous ASMs: Levetiracetam  Currenty ASMs: Valproic acid and oxcarbazepine  ASMs side effects: Tremors, dizziness   Brain Images: CT head, no acute intracranial abnormality.  Previous EEGs: EEG normal, no seizure or epileptiform discharge seen   OTHER MEDICAL CONDITIONS: Autism spectrum disorder, intellectual disability.  REVIEW OF SYSTEMS: Full 14 system review of systems performed and negative with exception of: Unable to fully  obtain due to patient intellectual disability.  ALLERGIES: No Known Allergies  HOME MEDICATIONS: Outpatient Medications Prior to Visit  Medication Sig Dispense Refill   benztropine (COGENTIN) 1 MG tablet Take 1 mg by mouth 2 (two) times daily.     cetirizine (ZYRTEC) 10 MG tablet Take 10 mg by mouth at bedtime.      divalproex (DEPAKOTE) 500 MG DR tablet Take 500 mg by mouth 3 (three)  times daily.     FLUoxetine (PROZAC) 10 MG capsule Take 10 mg by mouth daily.     fluticasone (VERAMYST) 27.5 MCG/SPRAY nasal spray Place 1 spray into the nose daily.      haloperidol (HALDOL) 10 MG tablet Take 10 mg by mouth 3 (three) times daily.     OLANZapine (ZYPREXA) 5 MG tablet Take 5 mg by mouth at bedtime.     risperiDONE (RISPERDAL) 1 MG tablet Take 1.5 mg by mouth at bedtime.     risperiDONE (RISPERDAL) 2 MG tablet Take 2 mg by mouth in the morning.     selenium sulfide (SELSUN) 2.5 % shampoo Apply 1 application topically every Saturday.     temazepam (RESTORIL) 15 MG capsule Take 15 mg by mouth at bedtime.     traZODone (DESYREL) 150 MG tablet Take 150 mg by mouth at bedtime.     diazePAM (VALTOCO 5 MG DOSE) 5 MG/0.1ML LIQD Place 5 mg into the nose once for 1 dose. 1 each 2   divalproex (DEPAKOTE ER) 250 MG 24 hr tablet 500 (2 tabs) mg in AM/ 500 (2 tabs) mg at noon/ 750 (3 tabs) mg PM (Patient not taking: Reported on 02/06/2023) 270 tablet 3   haloperidol (HALDOL) 5 MG tablet Take 5 mg by mouth 3 (three) times daily. (Patient not taking: Reported on 02/06/2023)     Oxcarbazepine (TRILEPTAL) 600 MG tablet Take 1 tablet (600 mg total) by mouth 2 (two) times daily. 180 tablet 4   No facility-administered medications prior to visit.    PAST MEDICAL HISTORY: Past Medical History:  Diagnosis Date   Allergy    seasonal allergies   Anxiety    Autism spectrum    Bipolar 1 disorder (HCC)    Insomnia     PAST SURGICAL HISTORY: History reviewed. No pertinent surgical history.  FAMILY HISTORY: Family History  Family history unknown: Yes    SOCIAL HISTORY: Social History   Socioeconomic History   Marital status: Single    Spouse name: Not on file   Number of children: Not on file   Years of education: Not on file   Highest education level: Not on file  Occupational History   Not on file  Tobacco Use   Smoking status: Never   Smokeless tobacco: Never  Vaping Use   Vaping  Use: Never used  Substance and Sexual Activity   Alcohol use: Never   Drug use: Never   Sexual activity: Not Currently  Other Topics Concern   Not on file  Social History Narrative   autistic   Social Determinants of Health   Financial Resource Strain: Not on file  Food Insecurity: Not on file  Transportation Needs: Not on file  Physical Activity: Not on file  Stress: Not on file  Social Connections: Not on file  Intimate Partner Violence: Not on file    PHYSICAL EXAM  GENERAL EXAM/CONSTITUTIONAL: Vitals:  Vitals:   02/06/23 1006  BP: 132/78  Weight: 216 lb (98 kg)  Height: 5\' 11"  (1.803 m)  Body mass index is 30.13 kg/m. Wt Readings from Last 3 Encounters:  02/06/23 216 lb (98 kg)  05/03/22 185 lb (83.9 kg)  09/25/21 189 lb 9.5 oz (86 kg)   Patient is in no distress; well developed, nourished and groomed; neck is supple.   MUSCULOSKELETAL: Gait, strength, tone, movements noted in Neurologic exam below  NEUROLOGIC: MENTAL STATUS:      No data to display         Non verbal, can gesture and mimic. Has an helmet   CRANIAL NERVE:  2nd, 3rd, 4th, 6th -  visual fields full to confrontation, extraocular muscles intact, no nystagmus 5th - facial sensation symmetric 7th - facial strength symmetric 9th - palate elevates symmetrically, uvula midline 11th - shoulder shrug symmetric  MOTOR:  normal bulk and tone, full strength in the BUE, BLE   GAIT/STATION:  normal   DIAGNOSTIC DATA (LABS, IMAGING, TESTING) - I reviewed patient records, labs, notes, testing and imaging myself where available.  Lab Results  Component Value Date   WBC 7.7 09/27/2021   HGB 10.6 (L) 09/27/2021   HCT 31.2 (L) 09/27/2021   MCV 98.7 09/27/2021   PLT 192 09/27/2021      Component Value Date/Time   NA 138 09/27/2021 0246   K 3.7 09/27/2021 0246   CL 103 09/27/2021 0246   CO2 25 09/27/2021 0246   GLUCOSE 84 09/27/2021 0246   BUN 15 09/27/2021 0246   CREATININE 0.76  09/27/2021 0246   CALCIUM 8.7 (L) 09/27/2021 0246   PROT 6.3 (L) 09/25/2021 0408   ALBUMIN 3.3 (L) 09/25/2021 0408   AST 23 09/25/2021 0408   ALT 21 09/25/2021 0408   ALKPHOS 44 09/25/2021 0408   BILITOT 0.4 09/25/2021 0408   GFRNONAA >60 09/27/2021 0246   GFRAA >60 06/12/2020 1614   No results found for: "CHOL", "HDL", "LDLCALC", "LDLDIRECT", "TRIG" No results found for: "HGBA1C" Lab Results  Component Value Date   VITAMINB12 608 09/25/2021   No results found for: "TSH"   Depakote level December 26:  11 Depakote level January 12:    138  Sodium level Jan 2024: 132   Head CT 12/25 No acute intracranial pathology  EEG 12/26 This technically difficult study is within normal limits. No seizures or epileptiform discharges were seen throughout the recording. If suspicion for ictal- interictal activity remains a concern, a prolonged study can be considered.    I personally reviewed brain Images and previous EEG reports.    ASSESSMENT AND PLAN  27 y.o. year old male  with autism spectrum disorder and intellectual disability who is presenting  for seizure follow up.  No seizures since last visit, he is compliant with his Depakote 500 mg three times daily and his Trileptal 450 mg twice daily. Plan will be to check levels and continue patient on the same dose.  Follow up in 6 months     1. Seizure disorder (HCC)   2. Therapeutic drug monitoring      Patient Instructions  Continue Depakote 500 mg 3 times daily Continue with Trileptal 450 mg twice daily Will check ASMs level including CMP Continue follow-up with PCP Return in 6 months or sooner if worse   Per [facility] statutes, patients with seizures are not allowed to drive until they have been seizure-free for six months.  Other recommendations include using caution when using heavy equipment or power tools. Avoid working on ladders or at heights. Take showers instead of baths.  Do  not swim alone.  Ensure the  water temperature is not too high on the home water heater. Do not go swimming alone. Do not lock yourself in a room alone (i.e. bathroom). When caring for infants or small children, sit down when holding, feeding, or changing them to minimize risk of injury to the child in the event you have a seizure. Maintain good sleep hygiene. Avoid alcohol.  Also recommend adequate sleep, hydration, good diet and minimize stress.   During the Seizure  - First, ensure adequate ventilation and place patients on the floor on their left side  Loosen clothing around the neck and ensure the airway is patent. If the patient is clenching the teeth, do not force the mouth open with any object as this can cause severe damage - Remove all items from the surrounding that can be hazardous. The patient may be oblivious to what's happening and may not even know what he or she is doing. If the patient is confused and wandering, either gently guide him/her away and block access to outside areas - Reassure the individual and be comforting - Call 911. In most cases, the seizure ends before EMS arrives. However, there are cases when seizures may last over 3 to 5 minutes. Or the individual may have developed breathing difficulties or severe injuries. If a pregnant patient or a person with diabetes develops a seizure, it is prudent to call an ambulance. - Finally, if the patient does not regain full consciousness, then call EMS. Most patients will remain confused for about 45 to 90 minutes after a seizure, so you must use judgment in calling for help. - Avoid restraints but make sure the patient is in a bed with padded side rails - Place the individual in a lateral position with the neck slightly flexed; this will help the saliva drain from the mouth and prevent the tongue from falling backward - Remove all nearby furniture and other hazards from the area - Provide verbal assurance as the individual is regaining consciousness -  Provide the patient with privacy if possible - Call for help and start treatment as ordered by the caregiver   After the Seizure (Postictal Stage)  After a seizure, most patients experience confusion, fatigue, muscle pain and/or a headache. Thus, one should permit the individual to sleep. For the next few days, reassurance is essential. Being calm and helping reorient the person is also of importance.  Most seizures are painless and end spontaneously. Seizures are not harmful to others but can lead to complications such as stress on the lungs, brain and the heart. Individuals with prior lung problems may develop labored breathing and respiratory distress.     Orders Placed This Encounter  Procedures   Valproic Acid Level   10-Hydroxycarbazepine   CMP    No orders of the defined types were placed in this encounter.   Return in about 6 months (around 08/09/2023).    Windell Norfolk, MD 02/06/2023, 1:59 PM  [facility] [address], [address] [place], [place] [address] [phone]

## 2023-02-11 LAB — COMPREHENSIVE METABOLIC PANEL
ALT: 13 IU/L (ref 0–44)
AST: 19 IU/L (ref 0–40)
Albumin/Globulin Ratio: 1.3 (ref 1.2–2.2)
Albumin: 4.3 g/dL (ref 4.3–5.2)
Alkaline Phosphatase: 71 IU/L (ref 44–121)
BUN/Creatinine Ratio: 12 (ref 9–20)
BUN: 8 mg/dL (ref 6–20)
Bilirubin Total: 0.2 mg/dL (ref 0.0–1.2)
CO2: 24 mmol/L (ref 20–29)
Calcium: 9.1 mg/dL (ref 8.7–10.2)
Chloride: 98 mmol/L (ref 96–106)
Creatinine, Ser: 0.67 mg/dL — ABNORMAL LOW (ref 0.76–1.27)
Globulin, Total: 3.3 g/dL (ref 1.5–4.5)
Glucose: 86 mg/dL (ref 70–99)
Potassium: 4.6 mmol/L (ref 3.5–5.2)
Sodium: 137 mmol/L (ref 134–144)
Total Protein: 7.6 g/dL (ref 6.0–8.5)
eGFR: 132 mL/min/{1.73_m2} (ref >59)

## 2023-02-11 LAB — VALPROIC ACID LEVEL: Valproic Acid Lvl: 93 ug/mL (ref 50–100)

## 2023-02-11 LAB — 10-HYDROXYCARBAZEPINE: Oxcarbazepine SerPl-Mcnc: 12 ug/mL (ref 10–35)

## 2023-02-11 NOTE — Progress Notes (Signed)
Please call and advise the patient that the recent labs we checked were within normal limits. No further action is required on these tests at this time. Please remind patient to keep any upcoming appointments or tests and to call us with any interim questions, concerns, problems or updates. Thanks,   Rita Prom, MD  

## 2023-05-06 ENCOUNTER — Ambulatory Visit: Payer: Medicare Other | Admitting: Neurology

## 2023-08-14 ENCOUNTER — Ambulatory Visit (INDEPENDENT_AMBULATORY_CARE_PROVIDER_SITE_OTHER): Payer: Medicare Other | Admitting: Neurology

## 2023-08-14 ENCOUNTER — Encounter: Payer: Self-pay | Admitting: Neurology

## 2023-08-14 VITALS — BP 98/74 | HR 76 | Resp 16 | Wt 234.5 lb

## 2023-08-14 DIAGNOSIS — F840 Autistic disorder: Secondary | ICD-10-CM | POA: Diagnosis not present

## 2023-08-14 DIAGNOSIS — F79 Unspecified intellectual disabilities: Secondary | ICD-10-CM

## 2023-08-14 DIAGNOSIS — G40909 Epilepsy, unspecified, not intractable, without status epilepticus: Secondary | ICD-10-CM | POA: Diagnosis not present

## 2023-08-14 MED ORDER — OXCARBAZEPINE 300 MG PO TABS: 450.0000 mg | ORAL_TABLET | Freq: Two times a day (BID) | ORAL | 3 refills | Status: AC

## 2023-08-14 MED ORDER — DIVALPROEX SODIUM 500 MG PO DR TAB: 500.0000 mg | DELAYED_RELEASE_TABLET | Freq: Three times a day (TID) | ORAL | 3 refills | Status: AC

## 2023-08-14 NOTE — Patient Instructions (Signed)
Continue with Depakote 500 mg 3 times daily Continue with oxcarbazepine 450 mg twice daily Continue other medication Return in 1 year or sooner if worse.

## 2023-08-14 NOTE — Progress Notes (Signed)
[facility]  PATIENT: Shane Dickerson DOB: 1996-09-10  REQUESTING CLINICIAN: Stevphen Rochester, MD HISTORY FROM: Caregiver  REASON FOR VISIT: Seizure   HISTORICAL  CHIEF COMPLAINT:  Chief Complaint  Patient presents with   Seizures    Rm12, caregiver present, Sz: last 1 in 08/2021 no issues, no concerns   INTERVAL HISTORY 08/14/2023:  Patient presents today for follow-up, last visit was in May, he is accompanied by caregiver.  They report no seizure or seizure like activity.  He is active at the group home.  Their main concern is his weight gain but he starts doing physical activity, walking around the house after meal.  No falls and no other complaint or concerns.   INTERVAL HISTORY 02/06/2023:  Patient presents today for follow-up, he is accompanied by caregiver Shane Dickerson.  At last visit in August, plan was to continue with Depakote 500/500/750 and Trileptal 600/600.  At the facility, his sodium was low and Depakote was decreased to 500 3 times daily and Trileptal was decreased to 450 twice daily.  He has not had any additional seizures.  His last sodium level in January 2024 was 132.  Caregiver informed us that he is PCP had done additional sodium check and it was still low and he was referred here.  Again he does not have experience any signs of hyponatremia.  Review of his lab work has show previous history of low sodium to 132. He is still banging his head, he does have a helmet, scheduled to see psychiatry this week again     INTERVAL HISTORY 05/03/2022:  Shane Dickerson presents today for follow-up, he is accompanied by caregiver Shane Dickerson.  No seizure since last visit but still has issues with behavior.  He is compliant with his Depakote 500/500/750 and his Trileptal 600/600.  They have not follow-up with psychiatry.  Caregiver report worsening behavior, hitting his head on the wall.  Again no other seizures.  HISTORY OF PRESENT ILLNESS:  This is a 27 year old gentleman  with past medical history of autism spectrum disorder, intellectual disability who is presenting following a recent admission to the hospital for seizures.  Per caregiver patient had 2 tonic-clonic seizure on Christmas Day lasting up to 3-minutes.  He was taken to the ED and in the ED he was also noted to have a third generalized convulsion.  He was initially loaded on Keppra but due to his behavioral history, Keppra was discontinued and his Depakote and oxcarbazepine was increased.  Per caregiver there was a possibility that patient was not compliant with his Depakote, he had missed a few doses.  His level at that time was 11.  Since discharge from the hospital, he has not had any additional seizures but they have noticed increased lightheadedness and tremors.  There is also report of change in behavior, he has been banging his head against the wall a couple times a week.  He has not seen psychiatry since 2021, caregiver has set up an appointment with psychiatrist and they plan to see him to follow-up next month. On January 12, does not follow with his primary care doctor, had his valproic acid level taken and it was 138.  They report sleep is good, appetite is good, denies any recent falls.    Handedness: right handed   Onset: Christmas 2022  Seizure Type: Generalized convulstion  Current frequency: Once  Any injuries from seizures: No   Seizure risk factors: ASD  Previous ASMs: Levetiracetam  Currenty ASMs: Valproic acid and  oxcarbazepine  ASMs side effects: Tremors, dizziness   Brain Images: CT head, no acute intracranial abnormality.  Previous EEGs: EEG normal, no seizure or epileptiform discharge seen   OTHER MEDICAL CONDITIONS: Autism spectrum disorder, intellectual disability.  REVIEW OF SYSTEMS: Full 14 system review of systems performed and negative with exception of: Unable to fully obtain due to patient intellectual disability.  ALLERGIES: No Known Allergies  HOME  MEDICATIONS: Outpatient Medications Prior to Visit  Medication Sig Dispense Refill   benztropine (COGENTIN) 1 MG tablet Take 1 mg by mouth 2 (two) times daily.     cetirizine (ZYRTEC) 10 MG tablet Take 10 mg by mouth at bedtime.      diazePAM (VALTOCO 5 MG DOSE) 5 MG/0.1ML LIQD Place 5 mg into the nose once for 1 dose. 1 each 2   FLUoxetine (PROZAC) 10 MG capsule Take 10 mg by mouth daily.     fluticasone (VERAMYST) 27.5 MCG/SPRAY nasal spray Place 1 spray into the nose daily.      haloperidol (HALDOL) 10 MG tablet Take 10 mg by mouth 3 (three) times daily.     haloperidol (HALDOL) 5 MG tablet Take 5 mg by mouth 3 (three) times daily.     OLANZapine (ZYPREXA) 5 MG tablet Take 5 mg by mouth at bedtime.     risperiDONE (RISPERDAL) 1 MG tablet Take 1.5 mg by mouth at bedtime.     risperiDONE (RISPERDAL) 2 MG tablet Take 2 mg by mouth in the morning.     selenium sulfide (SELSUN) 2.5 % shampoo Apply 1 application topically every Saturday.     temazepam (RESTORIL) 15 MG capsule Take 15 mg by mouth at bedtime.     traZODone (DESYREL) 150 MG tablet Take 150 mg by mouth at bedtime.     divalproex (DEPAKOTE ER) 250 MG 24 hr tablet 500 (2 tabs) mg in AM/ 500 (2 tabs) mg at noon/ 750 (3 tabs) mg PM 270 tablet 3   divalproex (DEPAKOTE) 500 MG DR tablet Take 500 mg by mouth 3 (three) times daily.     Oxcarbazepine (TRILEPTAL) 600 MG tablet Take 1 tablet (600 mg total) by mouth 2 (two) times daily. 180 tablet 4   No facility-administered medications prior to visit.    PAST MEDICAL HISTORY: Past Medical History:  Diagnosis Date   Allergy    seasonal allergies   Anxiety    Autism spectrum    Bipolar 1 disorder (HCC)    Insomnia     PAST SURGICAL HISTORY: History reviewed. No pertinent surgical history.  FAMILY HISTORY: Family History  Family history unknown: Yes    SOCIAL HISTORY: Social History   Socioeconomic History   Marital status: Single    Spouse name: Not on file   Number of  children: Not on file   Years of education: Not on file   Highest education level: Not on file  Occupational History   Not on file  Tobacco Use   Smoking status: Never   Smokeless tobacco: Never  Vaping Use   Vaping status: Never Used  Substance and Sexual Activity   Alcohol use: Never   Drug use: Never   Sexual activity: Not Currently  Other Topics Concern   Not on file  Social History Narrative   autistic   Social Determinants of Health   Financial Resource Strain: Low Risk  (12/17/2022)   Received from [facility], [facility]   Overall Financial Resource Strain (CARDIA)    Difficulty of Paying Living  Expenses: Not hard at all  Food Insecurity: No Food Insecurity (12/17/2022)   Received [facility], [facility]   Hunger Vital Sign    Worried About Running Out of Food in the Last Year: Never true    Ran Out of Food in the Last Year: Never true  Transportation Needs: No Transportation Needs (12/17/2022)   Received from [facility], [facility]   [facility] - Transportation    Lack of Transportation (Medical): No    Lack of Transportation (Non-Medical): No  Physical Activity: Sufficiently Active (09/17/2022)   Received from [facility], [facility]   Exercise Vital Sign    Days of Exercise per Week: 5 days    Minutes of Exercise per Session: 30 min  Recent Concern: Physical Activity - Insufficiently Active (07/06/2022)   Received from [facility]   Exercise Vital Sign    Days of Exercise per Week: 1 day    Minutes of Exercise per Session: 10 min  Stress: No Stress Concern Present (09/17/2022)   Received from [facility], [facility] of Occupational Health - Occupational Stress Questionnaire    Feeling of Stress : Not at all  Recent Concern: Stress - Stress Concern Present (07/06/2022)   Received from [facility] of Occupational Health - Occupational Stress Questionnaire    Feeling of Stress : To some  extent  Social Connections: Socially Integrated (09/17/2022)   Received from [facility], [facility]   Social Network    How would you rate your social network (family, work, friends)?: Good participation with social networks  Intimate Partner Violence: Not At Risk (09/17/2022)   Received from [facility], [facility]   HITS    Over the last 12 months how often did your partner physically hurt you?: Never    Over the last 12 months how often did your partner insult you or talk down to you?: Never    Over the last 12 months how often did your partner threaten you with physical harm?: Never    Over the last 12 months how often did your partner scream or curse at you?: Never    PHYSICAL EXAM  GENERAL EXAM/CONSTITUTIONAL: Vitals:  Vitals:   08/14/23 1108  BP: 98/74  Pulse: 76  Resp: 16  Weight: 234 lb 8 oz (106.4 kg)    Body mass index is 32.71 kg/m. Wt Readings from Last 3 Encounters:  08/14/23 234 lb 8 oz (106.4 kg)  02/06/23 216 lb (98 kg)  05/03/22 185 lb (83.9 kg)   Patient is in no distress; well developed, nourished and groomed; neck is supple.   MUSCULOSKELETAL: Gait, strength, tone, movements noted in Neurologic exam below  NEUROLOGIC: MENTAL STATUS:      No data to display         Non verbal, can gesture and mimic. Has an helmet  2nd, 3rd, 4th, 6th -  visual fields full to confrontation, extraocular muscles intact, no nystagmus 5th - facial sensation symmetric 7th - facial strength symmetric  MOTOR:  normal bulk and tone, full strength in the BUE, BLE   GAIT/STATION:  normal   DIAGNOSTIC DATA (LABS, IMAGING, TESTING) - I reviewed patient records, labs, notes, testing and imaging myself where available.  Lab Results  Component Value Date   WBC 7.7 09/27/2021   HGB 10.6 (L) 09/27/2021   HCT 31.2 (L) 09/27/2021   MCV 98.7 09/27/2021   PLT 192 09/27/2021  Component Value Date/Time   NA 137 02/06/2023 1034   K 4.6 02/06/2023 1034    CL 98 02/06/2023 1034   CO2 24 02/06/2023 1034   GLUCOSE 86 02/06/2023 1034   GLUCOSE 84 09/27/2021 0246   BUN 8 02/06/2023 1034   CREATININE 0.67 (L) 02/06/2023 1034   CALCIUM 9.1 02/06/2023 1034   PROT 7.6 02/06/2023 1034   ALBUMIN 4.3 02/06/2023 1034   AST 19 02/06/2023 1034   ALT 13 02/06/2023 1034   ALKPHOS 71 02/06/2023 1034   BILITOT 0.2 02/06/2023 1034   GFRNONAA >60 09/27/2021 0246   GFRAA >60 06/12/2020 1614   No results found for: "CHOL", "HDL", "LDLCALC", "LDLDIRECT", "TRIG" No results found for: "HGBA1C" Lab Results  Component Value Date   VITAMINB12 608 09/25/2021   No results found for: "TSH"   Depakote level December 26:  11 Depakote level January 12:    138  Sodium level Jan 2024: 132 --> 138 --> 137 (02/06/2023)   Head CT 12/25 No acute intracranial pathology  EEG 12/26 This technically difficult study is within normal limits. No seizures or epileptiform discharges were seen throughout the recording. If suspicion for ictal- interictal activity remains a concern, a prolonged study can be considered.     ASSESSMENT AND PLAN  27 y.o. year old male  with autism spectrum disorder and intellectual disability who is presenting  for seizure follow up.  No seizures since last visit, he is compliant with his Depakote 500 mg three times daily and his Trileptal 450 mg twice daily. Plan will be to continue patient on the same dose. Follow up in a year     1. Seizure disorder (HCC)   2. Intellectual disability   3. Autism spectrum disorder     Patient Instructions  Continue with Depakote 500 mg 3 times daily Continue with oxcarbazepine 450 mg twice daily Continue other medication Return in 1 year or sooner if worse.   Per [facility] statutes, patients with seizures are not allowed to drive until they have been seizure-free for six months.  Other recommendations include using caution when using heavy equipment or power tools. Avoid working on  ladders or at heights. Take showers instead of baths.  Do not swim alone.  Ensure the water temperature is not too high on the home water heater. Do not go swimming alone. Do not lock yourself in a room alone (i.e. bathroom). When caring for infants or small children, sit down when holding, feeding, or changing them to minimize risk of injury to the child in the event you have a seizure. Maintain good sleep hygiene. Avoid alcohol.  Also recommend adequate sleep, hydration, good diet and minimize stress.   During the Seizure  - First, ensure adequate ventilation and place patients on the floor on their left side  Loosen clothing around the neck and ensure the airway is patent. If the patient is clenching the teeth, do not force the mouth open with any object as this can cause severe damage - Remove all items from the surrounding that can be hazardous. The patient may be oblivious to what's happening and may not even know what he or she is doing. If the patient is confused and wandering, either gently guide him/her away and block access to outside areas - Reassure the individual and be comforting - Call 911. In most cases, the seizure ends before EMS arrives. However, there are cases when seizures may last over 3 to 5 minutes. Or the  individual may have developed breathing difficulties or severe injuries. If a pregnant patient or a person with diabetes develops a seizure, it is prudent to call an ambulance. - Finally, if the patient does not regain full consciousness, then call EMS. Most patients will remain confused for about 45 to 90 minutes after a seizure, so you must use judgment in calling for help. - Avoid restraints but make sure the patient is in a bed with padded side rails - Place the individual in a lateral position with the neck slightly flexed; this will help the saliva drain from the mouth and prevent the tongue from falling backward - Remove all nearby furniture and other hazards from the  area - Provide verbal assurance as the individual is regaining consciousness - Provide the patient with privacy if possible - Call for help and start treatment as ordered by the caregiver   After the Seizure (Postictal Stage)  After a seizure, most patients experience confusion, fatigue, muscle pain and/or a headache. Thus, one should permit the individual to sleep. For the next few days, reassurance is essential. Being calm and helping reorient the person is also of importance.  Most seizures are painless and end spontaneously. Seizures are not harmful to others but can lead to complications such as stress on the lungs, brain and the heart. Individuals with prior lung problems may develop labored breathing and respiratory distress.     No orders of the defined types were placed in this encounter.   Meds ordered this encounter  Medications   divalproex (DEPAKOTE) 500 MG DR tablet    Sig: Take 1 tablet (500 mg total) by mouth 3 (three) times daily.    Dispense:  270 tablet    Refill:  3   oxcarbazepine (TRILEPTAL) 300 MG tablet    Sig: Take 1.5 tablets (450 mg total) by mouth 2 (two) times daily.    Dispense:  270 tablet    Refill:  3    Return in about 1 year (around 08/13/2024).    Windell Norfolk, MD 08/14/2023, 11:36 AM  [facility] [address] [place], [place] [address] [phone]

## 2023-08-23 DIAGNOSIS — F71 Moderate intellectual disabilities: Secondary | ICD-10-CM | POA: Insufficient documentation

## 2023-08-23 DIAGNOSIS — F0630 Mood disorder due to known physiological condition, unspecified: Secondary | ICD-10-CM | POA: Insufficient documentation

## 2023-08-23 DIAGNOSIS — F6381 Intermittent explosive disorder: Secondary | ICD-10-CM | POA: Insufficient documentation

## 2023-09-14 ENCOUNTER — Emergency Department (HOSPITAL_BASED_OUTPATIENT_CLINIC_OR_DEPARTMENT_OTHER)
Admission: EM | Admit: 2023-09-14 | Discharge: 2023-09-14 | Disposition: A | Discharge status: Home / Self Care | Payer: Medicare Other | Attending: Emergency Medicine | Admitting: Emergency Medicine

## 2023-09-14 ENCOUNTER — Encounter (HOSPITAL_BASED_OUTPATIENT_CLINIC_OR_DEPARTMENT_OTHER): Payer: Self-pay

## 2023-09-14 ENCOUNTER — Other Ambulatory Visit: Payer: Self-pay

## 2023-09-14 DIAGNOSIS — J069 Acute upper respiratory infection, unspecified: Secondary | ICD-10-CM | POA: Diagnosis not present

## 2023-09-14 DIAGNOSIS — Z1152 Encounter for screening for COVID-19: Secondary | ICD-10-CM | POA: Insufficient documentation

## 2023-09-14 DIAGNOSIS — R059 Cough, unspecified: Secondary | ICD-10-CM | POA: Diagnosis present

## 2023-09-14 LAB — RESP PANEL BY RT-PCR (RSV, FLU A&B, COVID)  RVPGX2
Influenza A by PCR: NEGATIVE
Influenza B by PCR: NEGATIVE
Resp Syncytial Virus by PCR: NEGATIVE
SARS Coronavirus 2 by RT PCR: NEGATIVE

## 2023-09-14 NOTE — ED Provider Notes (Signed)
[place] [facility] Provider Note   CSN: [id number] Arrival date & time: 09/14/23  4098     History  Chief Complaint  Patient presents with   Cough    Shane Dickerson is a 27 y.o. male.  The history is provided by a caregiver. The history is limited by the condition of the patient.  Cough Cough characteristics:  Non-productive Severity:  Moderate Onset quality:  Gradual Duration:  1 week Timing:  Intermittent Progression:  Unchanged Chronicity:  New Context: sick contacts and upper respiratory infection   Context comment:  Group home where all members came on bus and checked in to be seen Relieved by:  Nothing Worsened by:  Nothing Ineffective treatments:  None tried Associated symptoms: no fever, no rhinorrhea and no sinus congestion   Risk factors: no recent infection        Home Medications Prior to Admission medications   Medication Sig Start Date End Date Taking? Authorizing Provider  benztropine (COGENTIN) 1 MG tablet Take 1 mg by mouth 2 (two) times daily.    [provider]  cetirizine (ZYRTEC) 10 MG tablet Take 10 mg by mouth at bedtime.     [provider]  diazePAM (VALTOCO 5 MG DOSE) 5 MG/0.1ML LIQD Place 5 mg into the nose once for 1 dose. 10/09/22 05/18/24  Windell Norfolk, MD  divalproex (DEPAKOTE) 500 MG DR tablet Take 1 tablet (500 mg total) by mouth 3 (three) times daily. 08/14/23 08/08/24  Windell Norfolk, MD  FLUoxetine (PROZAC) 10 MG capsule Take 10 mg by mouth daily.    [provider]  fluticasone (VERAMYST) 27.5 MCG/SPRAY nasal spray Place 1 spray into the nose daily.     [provider]  haloperidol (HALDOL) 10 MG tablet Take 10 mg by mouth 3 (three) times daily.    [provider]  haloperidol (HALDOL) 5 MG tablet Take 5 mg by mouth 3 (three) times daily.    [provider]  OLANZapine (ZYPREXA) 5 MG tablet Take 5 mg by mouth at bedtime.    [provider]  oxcarbazepine (TRILEPTAL) 300 MG tablet Take 1.5 tablets (450 mg total) by mouth 2 (two) times daily. 08/14/23 08/08/24  Windell Norfolk, MD  risperiDONE (RISPERDAL) 1 MG tablet Take 1.5 mg by mouth at bedtime.    [provider]  risperiDONE (RISPERDAL) 2 MG tablet Take 2 mg by mouth in the morning.    [provider]  selenium sulfide (SELSUN) 2.5 % shampoo Apply 1 application topically every Saturday.    [provider]  temazepam (RESTORIL) 15 MG capsule Take 15 mg by mouth at bedtime.    [provider]  traZODone (DESYREL) 150 MG tablet Take 150 mg by mouth at bedtime. 09/19/21   [provider]      Allergies    Patient has no known allergies.    Review of Systems   Review of Systems  Constitutional:  Negative for fever.  HENT:  Positive for sneezing. Negative for rhinorrhea.   Respiratory:  Positive for cough.   All other systems reviewed and are negative.   Physical Exam Updated Vital Signs BP 105/73 (BP Location: Left Arm)   Pulse 79   Temp 98.3 F (36.8 C)   Resp 17   Wt 104.8 kg   SpO2 99%   BMI 32.22 kg/m  Physical Exam Vitals and nursing note reviewed.  Constitutional:      General: He is not in  acute distress.    Appearance: Normal appearance. He is well-developed. He is not diaphoretic.  HENT:     Head: Normocephalic and atraumatic.     Nose: Nose normal.     Mouth/Throat:     Mouth: Mucous membranes are moist.     Pharynx: Oropharynx is clear.  Eyes:     Conjunctiva/sclera: Conjunctivae normal.     Pupils: Pupils are equal, round, and reactive to light.  Cardiovascular:     Rate and Rhythm: Normal rate and regular rhythm.     Pulses: Normal pulses.     Heart sounds: Normal heart sounds.  Pulmonary:     Effort: Pulmonary effort is normal.     Breath sounds: Normal breath sounds. No wheezing or rales.  Abdominal:     General: Bowel sounds are normal.     Palpations: Abdomen is soft.      Tenderness: There is no abdominal tenderness. There is no guarding or rebound.  Musculoskeletal:        General: Normal range of motion.     Cervical back: Normal range of motion and neck supple.  Skin:    General: Skin is warm and dry.     Capillary Refill: Capillary refill takes less than 2 seconds.  Neurological:     General: No focal deficit present.     Mental Status: He is alert and oriented to person, place, and time.     Deep Tendon Reflexes: Reflexes normal.  Psychiatric:        Mood and Affect: Mood normal.     ED Results / Procedures / Treatments   Labs (all labs ordered are listed, but only abnormal results are displayed) Labs Reviewed  RESP PANEL BY RT-PCR (RSV, FLU A&B, COVID)  [id number]    EKG None  Radiology No results found.  Procedures Procedures    Medications Ordered in ED Medications - No data to display  ED Course/ Medical Decision Making/ A&P                                 Medical Decision Making Patient and co home mates with URI x 1 week  Amount and/or Complexity of Data Reviewed Independent Historian: caregiver    Details: See above  External Data Reviewed: notes.    Details: Previous notes reviewed  Labs: ordered.    Details: Negative covid and flu   Risk Risk Details: Negative covid and flu normal exam and vitals.  Stable for discharge with close follow up.      Final Clinical Impression(s) / ED Diagnoses Final diagnoses:  Viral URI with cough   Return for intractable cough, coughing up blood, fevers > 100.4 unrelieved by medication, shortness of breath, intractable vomiting, chest pain, shortness of breath, weakness, numbness, changes in speech, facial asymmetry, abdominal pain, passing out, Inability to tolerate liquids or food, cough, altered mental status or any concerns. No signs of systemic illness or infection. The patient is nontoxic-appearing on exam and vital signs are within normal limits.  I have reviewed the triage  vital signs and the nursing notes. Pertinent labs & imaging results that were available during my care of the patient were reviewed by me and considered in my medical decision making (see chart for details). After history, exam, and medical workup I feel the patient has been appropriately medically screened and is safe for discharge home. Pertinent diagnoses were discussed with the patient. Patient  was given return precautions.    Rx / DC Orders ED Discharge Orders     None         Addysin Porco, MD 09/14/23 [phone]

## 2023-09-14 NOTE — ED Triage Notes (Signed)
  Signed      PT to triage c/o cough x 4 days. PT is from group home. PT denies fever NVD. VSS NAD PT on room air. Covid swab completed

## 2023-09-21 ENCOUNTER — Emergency Department (HOSPITAL_COMMUNITY)
Admission: EM | Admit: 2023-09-21 | Discharge: 2023-09-21 | Disposition: A | Discharge status: Home / Self Care | Payer: Medicare Other | Attending: Emergency Medicine | Admitting: Emergency Medicine

## 2023-09-21 DIAGNOSIS — T7621XA Adult sexual abuse, suspected, initial encounter: Secondary | ICD-10-CM | POA: Diagnosis present

## 2023-09-21 DIAGNOSIS — F840 Autistic disorder: Secondary | ICD-10-CM | POA: Diagnosis not present
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
## 2024-03-02 DIAGNOSIS — Z7689 Persons encountering health services in other specified circumstances: Secondary | ICD-10-CM | POA: Insufficient documentation

## 2024-08-13 ENCOUNTER — Encounter: Payer: Self-pay | Admitting: Neurology

## 2024-08-13 ENCOUNTER — Telehealth: Payer: Self-pay | Admitting: Neurology

## 2024-08-13 ENCOUNTER — Ambulatory Visit: Payer: Medicare Other | Admitting: Neurology

## 2024-08-13 VITALS — Ht 72.0 in | Wt 223.0 lb

## 2024-08-13 DIAGNOSIS — F840 Autistic disorder: Secondary | ICD-10-CM | POA: Diagnosis not present

## 2024-08-13 DIAGNOSIS — G40909 Epilepsy, unspecified, not intractable, without status epilepticus: Secondary | ICD-10-CM

## 2024-08-13 DIAGNOSIS — F79 Unspecified intellectual disabilities: Secondary | ICD-10-CM

## 2024-08-13 NOTE — Telephone Encounter (Signed)
 [facility] called to verify appointment and date.

## 2024-08-13 NOTE — Progress Notes (Signed)
 [facility]  PATIENT: Shane Dickerson DOB: 1996-02-13  REQUESTING CLINICIAN: Gladystine Erminio CROME, MD HISTORY FROM: Caregiver  REASON FOR VISIT: Seizure   HISTORICAL  CHIEF COMPLAINT:  Chief Complaint  Patient presents with   RM12/SEIZURE    Pt is here with his caretaker. Pt's caretaker states pt hasn't had any new seizure activity.     INTERVAL HISTORY 08/13/2024 [name] presents today for follow-up, he is accompanied by caregiver.  Last visit was a year ago, since then he has not had any seizure or seizure activity.  He is compliant with his Depakote  and oxcarbazepine .  No seizures.  They report some diarrhea and some issues with behavior, sometimes does not sleep through the night but overall he is stable.  They do have a psychiatrist.  No other complaints or concerns. His most recent labs in February showed Depakote  level of 82 and a normal liver function test   INTERVAL HISTORY 08/14/2023:  Patient presents today for follow-up, last visit was in May, he is accompanied by caregiver.  They report no seizure or seizure like activity.  He is active at the group home.  Their main concern is his weight gain but he starts doing physical activity, walking around the house after meal.  No falls and no other complaint or concerns.   INTERVAL HISTORY 02/06/2023:  Patient presents today for follow-up, he is accompanied by caregiver [name].  At last visit in August, plan was to continue with Depakote  500/500/750 and Trileptal  600/600.  At the facility, his sodium was low and Depakote  was decreased to 500 3 times daily and Trileptal  was decreased to 450 twice daily.  He has not had any additional seizures.  His last sodium level in January 2024 was 132.  Caregiver informed us  that he is PCP had done additional sodium check and it was still low and he was referred here.  Again he does not have experience any signs of hyponatremia.  Review of his lab work has show previous history of  low sodium to 132. He is still banging his head, he does have a helmet, scheduled to see psychiatry this week again   INTERVAL HISTORY 05/03/2022:  [name] presents today for follow-up, he is accompanied by caregiver Jyreece.  No seizure since last visit but still has issues with behavior.  He is compliant with his Depakote  500/500/750 and his Trileptal  600/600.  They have not follow-up with psychiatry.  Caregiver report worsening behavior, hitting his head on the wall.  Again no other seizures.  HISTORY OF PRESENT ILLNESS:  This is a 28 year old gentlemanleman with past medical history of autism spectrum disorder, intellectual disability who is presenting following a recent admission to the hospital for seizures.  Per caregiver patient had 2 tonic-clonic seizure on Christmas Day lasting up to 3-minutes.  He was taken to the ED and in the ED he was also noted to have a third generalized convulsion.  He was initially loaded on Keppra  but due to his behavioral history, Keppra  was discontinued and his Depakote  and oxcarbazepine  was increased.  Per caregiver there was a possibility that patient was not compliant with his Depakote , he had missed a few doses.  His level at that time was 11.  Since discharge from the hospital, he has not had any additional seizures but they have noticed increased lightheadedness and tremors.  There is also report of change in behavior, he has been banging his head against the wall a couple times a week.  He  has not seen psychiatry since 2021, caregiver has set up an appointment with psychiatrist and they plan to see him to follow-up next month. On January 12, does not follow with his primary care doctor, had his valproic acid  level taken and it was 138.  They report sleep is good, appetite is good, denies any recent falls.    Handedness: right handed   Onset: Christmas 2022  Seizure Type: Generalized convulstion  Current frequency: Once  Any injuries from seizures: No    Seizure risk factors: ASD  Previous ASMs: Levetiracetam   Currenty ASMs: Valproic acid  and oxcarbazepine   ASMs side effects: Tremors, dizziness   Brain Images: CT head, no acute intracranial abnormality.  Previous EEGs: EEG normal, no seizure or epileptiform discharge seen   OTHER MEDICAL CONDITIONS: Autism spectrum disorder, intellectual disability.  REVIEW OF SYSTEMS: Full 14 system review of systems performed and negative with exception of: Unable to fully obtain due to patient intellectual disability.  ALLERGIES: No Known Allergies  HOME MEDICATIONS: Outpatient Medications Prior to Visit  Medication Sig Dispense Refill   benztropine  (COGENTIN ) 1 MG tablet Take 1 mg by mouth 2 (two) times daily.     cetirizine (ZYRTEC) 10 MG tablet Take 10 mg by mouth at bedtime.      diazePAM  (VALTOCO  5 MG DOSE) 5 MG/0.1ML LIQD Place 5 mg into the nose once for 1 dose. 1 each 2   divalproex  (DEPAKOTE ) 500 MG DR tablet Take 1 tablet (500 mg total) by mouth 3 (three) times daily. 270 tablet 3   FLUoxetine  (PROZAC ) 10 MG capsule Take 10 mg by mouth daily.     fluticasone  (VERAMYST) 27.5 MCG/SPRAY nasal spray Place 1 spray into the nose daily.      haloperidol  (HALDOL ) 10 MG tablet Take 10 mg by mouth 3 (three) times daily.     haloperidol  (HALDOL ) 5 MG tablet Take 5 mg by mouth 3 (three) times daily.     OLANZapine (ZYPREXA) 5 MG tablet Take 5 mg by mouth at bedtime.     oxcarbazepine  (TRILEPTAL ) 300 MG tablet Take 1.5 tablets (450 mg total) by mouth 2 (two) times daily. 270 tablet 3   risperiDONE (RISPERDAL) 1 MG tablet Take 1.5 mg by mouth at bedtime.     risperiDONE (RISPERDAL) 2 MG tablet Take 2 mg by mouth in the morning.     selenium  sulfide (SELSUN ) 2.5 % shampoo Apply 1 application topically every Saturday.     temazepam  (RESTORIL ) 15 MG capsule Take 15 mg by mouth at bedtime.     traZODone  (DESYREL ) 150 MG tablet Take 150 mg by mouth at bedtime.     No facility-administered  medications prior to visit.    PAST MEDICAL HISTORY: Past Medical History:  Diagnosis Date   Allergy    seasonal allergies   Anxiety    Autism spectrum    Bipolar 1 disorder (HCC)    Insomnia     PAST SURGICAL HISTORY: History reviewed. No pertinent surgical history.  FAMILY HISTORY: Family History  Family history unknown: Yes    SOCIAL HISTORY: Social History   Socioeconomic History   Marital status: Single    Spouse name: Not on file   Number of children: Not on file   Years of education: Not on file   Highest education level: Not on file  Occupational History   Not on file  Tobacco Use   Smoking status: Never   Smokeless tobacco: Never  Vaping Use   Vaping status: Never Used  Substance and Sexual Activity   Alcohol use: Never   Drug use: Never   Sexual activity: Not Currently  Other Topics Concern   Not on file  Social History Narrative   autistic   Social Drivers of Health   Financial Resource Strain: Low Risk  (11/05/2023)   Received from [facility]   Overall Financial Resource Strain (CARDIA)    Difficulty of Paying Living Expenses: Not hard at all  Food Insecurity: No Food Insecurity (11/05/2023)   Received from [facility]   Hunger Vital Sign    Within the past 12 months, you worried that your food would run out before you got the money to buy more.: Never true    Within the past 12 months, the food you bought just didn't last and you didn't have money to get more.: Never true  Transportation Needs: No Transportation Needs (11/05/2023)   Received from [facility] - Transportation    Lack of Transportation (Medical): No    Lack of Transportation (Non-Medical): No  Physical Activity: Sufficiently Active (09/17/2022)   Received from [facility]   Exercise Vital Sign    On average, how many days per week do you engage in moderate to strenuous exercise (like a brisk walk)?: 5 days    On average, how many minutes do you engage in  exercise at this level?: 30 min  Recent Concern: Physical Activity - Insufficiently Active (07/06/2022)   Received from [facility]   Exercise Vital Sign    Days of Exercise per Week: 1 day    Minutes of Exercise per Session: 10 min  Stress: No Stress Concern Present (09/17/2022)   Received from [facility] - Occupational Stress Questionnaire    Feeling of Stress : Not at all  Recent Concern: Stress - Stress Concern Present (07/06/2022)   Received from [facility] of [facility]ational [facility] - Occupational Stress Questionnaire    Feeling of Stress : To some extent  Social Connections: Socially Integrated (09/17/2022)   Received from [facility]   Social Network    How would you rate your social network (family, work, friends)?: Good participation with social networks  Intimate Partner Violence: Not At Risk (09/17/2022)   Received from [facility]   HITS    Over the last 12 months how often did your partner physically hurt you?: Never    Over the last 12 months how often did your partner insult you or talk down to you?: Never    Over the last 12 months how often did your partner threaten you with physical harm?: Never    Over the last 12 months how often did your partner scream or curse at you?: Never    PHYSICAL EXAM  GENERAL EXAM/CONSTITUTIONAL: Vitals:  Vitals:   08/13/24 1132  Weight: 223 lb (101.2 kg)  Height: 6' (1.829 m)    Body mass index is 30.24 kg/m. Wt Readings from Last 3 Encounters:  08/13/24 223 lb (101.2 kg)  09/21/23 232 lb (105.2 kg)  09/14/23 231 lb (104.8 kg)   Patient is in no distress; well developed, nourished and groomed; neck is supple.   MUSCULOSKELETAL: Gait, strength, tone, movements noted in Neurologic exam below  NEUROLOGIC: MENTAL STATUS:      No data to display         Non verbal, can gesture and mimic. Has an helmet  2nd, 3rd, 4th, 6th -  visual  fields full to  confrontation, extraocular muscles intact, no nystagmus 5th - facial sensation symmetric 7th - facial strength symmetric  MOTOR:  normal bulk and tone, full strength in the BUE, BLE  GAIT/STATION:  normal   DIAGNOSTIC DATA (LABS, IMAGING, TESTING) - I reviewed patient records, labs, notes, testing and imaging myself where available.  Lab Results  Component Value Date   WBC 7.7 09/27/2021   HGB 10.6 (L) 09/27/2021   HCT 31.2 (L) 09/27/2021   MCV 98.7 09/27/2021   PLT 192 09/27/2021      Component Value Date/Time   NA 137 02/06/2023 1034   K 4.6 02/06/2023 1034   CL 98 02/06/2023 1034   CO2 24 02/06/2023 1034   GLUCOSE 86 02/06/2023 1034   GLUCOSE 84 09/27/2021 0246   BUN 8 02/06/2023 1034   CREATININE 0.67 (L) 02/06/2023 1034   CALCIUM 9.1 02/06/2023 1034   PROT 7.6 02/06/2023 1034   ALBUMIN 4.3 02/06/2023 1034   AST 19 02/06/2023 1034   ALT 13 02/06/2023 1034   ALKPHOS 71 02/06/2023 1034   BILITOT 0.2 02/06/2023 1034   GFRNONAA >60 09/27/2021 0246   GFRAA >60 06/12/2020 1614   No results found for: CHOL, HDL, LDLCALC, LDLDIRECT, TRIG No results found for: HGBA1C Lab Results  Component Value Date   VITAMINB12 608 09/25/2021   No results found for: TSH   Depakote  level December 26:  11 Depakote  level January 12:    138  Sodium level Jan 2024: 132 --> 138 --> 137 (02/06/2023)  Depakote  level February 2025: 82   Head CT 12/25 No acute intracranial pathology  EEG 12/26 This technically difficult study is within normal limits. No seizures or epileptiform discharges were seen throughout the recording. If suspicion for ictal- interictal activity remains a concern, a prolonged study can be considered.    ASSESSMENT AND PLAN  28 y.o. year old male  with autism spectrum disorder and intellectual disability who is presenting  for seizure follow up.  No seizures since last visit, he is compliant with his Depakote  500 mg three times daily and his  Trileptal  450 mg twice daily. Plan will be to continue patient on the same dose. Follow up in a year    1. Seizure disorder (HCC)   2. Intellectual disability   3. Autism spectrum disorder     Patient Instructions  Continue Depakote  500 mg 3 times daily Continue with oxcarbazepine  450 mg twice daily Continue your other medications Continue to follow-up with your doctors Return in 1 year or sooner if worse   Per [place]  DMV statutes, patients with seizures are not allowed to drive until they have been seizure-free for six months.  Other recommendations include using caution when using heavy equipment or power tools. Avoid working on ladders or at heights. Take showers instead of baths.  Do not swim alone.  Ensure the water temperature is not too high on the home water heater. Do not go swimming alone. Do not lock yourself in a room alone (i.e. bathroom). When caring for infants or small children, sit down when holding, feeding, or changing them to minimize risk of injury to the child in the event you have a seizure. Maintain good sleep hygiene. Avoid alcohol.  Also recommend adequate sleep, hydration, good diet and minimize stress.   During the Seizure  - First, ensure adequate ventilation and place patients on the floor on their left side  Loosen clothing around the neck and ensure the airway is  patent. If the patient is clenching the teeth, do not force the mouth open with any object as this can cause severe damage - Remove all items from the surrounding that can be hazardous. The patient may be oblivious to what's happening and may not even know what he or she is doing. If the patient is confused and wandering, either gently guide him/her away and block access to outside areas - Reassure the individual and be comforting - Call 911. In most cases, the seizure ends before EMS arrives. However, there are cases when seizures may last over 3 to 5 minutes. Or the individual may have  developed breathing difficulties or severe injuries. If a pregnant patient or a person with diabetes develops a seizure, it is prudent to call an ambulance. - Finally, if the patient does not regain full consciousness, then call EMS. Most patients will remain confused for about 45 to 90 minutes after a seizure, so you must use judgment in calling for help. - Avoid restraints but make sure the patient is in a bed with padded side rails - Place the individual in a lateral position with the neck slightly flexed; this will help the saliva drain from the mouth and prevent the tongue from falling backward - Remove all nearby furniture and other hazards from the area - Provide verbal assurance as the individual is regaining consciousness - Provide the patient with privacy if possible - Call for help and start treatment as ordered by the caregiver   After the Seizure (Postictal Stage)  After a seizure, most patients experience confusion, fatigue, muscle pain and/or a headache. Thus, one should permit the individual to sleep. For the next few days, reassurance is essential. Being calm and helping reorient the person is also of importance.  Most seizures are painless and end spontaneously. Seizures are not harmful to others but can lead to complications such as stress on the lungs, brain and the heart. Individuals with prior lung problems may develop labored breathing and respiratory distress.     No orders of the defined types were placed in this encounter.   No orders of the defined types were placed in this encounter.   Return in about 1 year (around 08/13/2025).   Pastor Falling, MD 08/13/2024, 11:58 AM  [facility] [address] [place], [place] [address] [phone]

## 2024-08-13 NOTE — Patient Instructions (Signed)
 Continue Depakote  500 mg 3 times daily Continue with oxcarbazepine  450 mg twice daily Continue your other medications Continue to follow-up with your doctors Return in 1 year or sooner if worse

## 2025-08-19 ENCOUNTER — Ambulatory Visit: Admitting: Neurology
